# Patient Record
Sex: Male | Born: 1940 | Race: Black or African American | Hispanic: No | Marital: Married | Smoking: Never smoker
Health system: Southern US, Community
[De-identification: ages and names within clinical notes are randomized; demographics above are authoritative.]

## PROBLEM LIST (undated history)

## (undated) DIAGNOSIS — I2510 Atherosclerotic heart disease of native coronary artery without angina pectoris: Secondary | ICD-10-CM

## (undated) DIAGNOSIS — E119 Type 2 diabetes mellitus without complications: Secondary | ICD-10-CM

## (undated) DIAGNOSIS — N183 Chronic kidney disease, stage 3 unspecified: Secondary | ICD-10-CM

## (undated) DIAGNOSIS — J90 Pleural effusion, not elsewhere classified: Secondary | ICD-10-CM

## (undated) DIAGNOSIS — I5032 Chronic diastolic (congestive) heart failure: Secondary | ICD-10-CM

## (undated) DIAGNOSIS — I10 Essential (primary) hypertension: Secondary | ICD-10-CM

## (undated) DIAGNOSIS — I739 Peripheral vascular disease, unspecified: Secondary | ICD-10-CM

## (undated) DIAGNOSIS — E782 Mixed hyperlipidemia: Secondary | ICD-10-CM

## (undated) HISTORY — DX: Chronic kidney disease, stage 3 unspecified: N18.30

## (undated) HISTORY — DX: Peripheral vascular disease, unspecified: I73.9

## (undated) HISTORY — DX: Pleural effusion, not elsewhere classified: J90

## (undated) HISTORY — DX: Essential (primary) hypertension: I10

## (undated) HISTORY — DX: Atherosclerotic heart disease of native coronary artery without angina pectoris: I25.10

## (undated) HISTORY — DX: Chronic kidney disease, stage 3 (moderate): N18.3

## (undated) HISTORY — DX: Chronic diastolic (congestive) heart failure: I50.32

## (undated) HISTORY — DX: Mixed hyperlipidemia: E78.2

## (undated) HISTORY — DX: Type 2 diabetes mellitus without complications: E11.9

---

## 1979-09-29 HISTORY — PX: OTHER SURGICAL HISTORY: SHX169

## 1997-09-28 HISTORY — PX: OTHER SURGICAL HISTORY: SHX169

## 1998-01-17 ENCOUNTER — Inpatient Hospital Stay (HOSPITAL_COMMUNITY): Admission: RE | Admit: 1998-01-17 | Discharge: 1998-01-22 | Payer: Self-pay | Admitting: Specialist

## 1998-02-26 ENCOUNTER — Encounter
Admission: RE | Admit: 1998-02-26 | Discharge: 1998-05-27 | Payer: Self-pay | Admitting: Physical Medicine and Rehabilitation

## 2000-06-14 ENCOUNTER — Encounter: Payer: Self-pay | Admitting: Internal Medicine

## 2000-06-14 ENCOUNTER — Ambulatory Visit (HOSPITAL_COMMUNITY): Admission: RE | Admit: 2000-06-14 | Discharge: 2000-06-14 | Payer: Self-pay | Admitting: *Deleted

## 2002-07-17 ENCOUNTER — Ambulatory Visit (HOSPITAL_COMMUNITY): Admission: RE | Admit: 2002-07-17 | Discharge: 2002-07-17 | Payer: Self-pay | Admitting: Internal Medicine

## 2003-08-31 ENCOUNTER — Emergency Department (HOSPITAL_COMMUNITY): Admission: EM | Admit: 2003-08-31 | Discharge: 2003-08-31 | Payer: Self-pay | Admitting: Emergency Medicine

## 2003-09-04 ENCOUNTER — Emergency Department (HOSPITAL_COMMUNITY): Admission: EM | Admit: 2003-09-04 | Discharge: 2003-09-04 | Payer: Self-pay | Admitting: *Deleted

## 2003-09-29 HISTORY — PX: TRANSURETHRAL RESECTION OF PROSTATE: SHX73

## 2003-09-29 HISTORY — PX: CYSTOSCOPY WITH URETHRAL DILATATION: SHX5125

## 2003-10-03 ENCOUNTER — Emergency Department (HOSPITAL_COMMUNITY): Admission: EM | Admit: 2003-10-03 | Discharge: 2003-10-03 | Payer: Self-pay | Admitting: Emergency Medicine

## 2003-10-15 ENCOUNTER — Inpatient Hospital Stay (HOSPITAL_COMMUNITY): Admission: RE | Admit: 2003-10-15 | Discharge: 2003-10-17 | Payer: Self-pay | Admitting: Urology

## 2004-02-11 ENCOUNTER — Ambulatory Visit (HOSPITAL_COMMUNITY): Admission: RE | Admit: 2004-02-11 | Discharge: 2004-02-11 | Payer: Self-pay | Admitting: Urology

## 2005-11-14 ENCOUNTER — Emergency Department (HOSPITAL_COMMUNITY): Admission: EM | Admit: 2005-11-14 | Discharge: 2005-11-14 | Payer: Self-pay | Admitting: Emergency Medicine

## 2005-11-15 ENCOUNTER — Emergency Department (HOSPITAL_COMMUNITY): Admission: EM | Admit: 2005-11-15 | Discharge: 2005-11-15 | Payer: Self-pay | Admitting: Emergency Medicine

## 2005-11-16 ENCOUNTER — Emergency Department (HOSPITAL_COMMUNITY): Admission: EM | Admit: 2005-11-16 | Discharge: 2005-11-17 | Payer: Self-pay | Admitting: Emergency Medicine

## 2007-01-06 ENCOUNTER — Ambulatory Visit: Payer: Self-pay | Admitting: Cardiology

## 2007-01-25 ENCOUNTER — Ambulatory Visit: Payer: Self-pay | Admitting: Cardiology

## 2007-11-05 ENCOUNTER — Ambulatory Visit: Payer: Self-pay | Admitting: Cardiology

## 2008-05-02 ENCOUNTER — Ambulatory Visit: Payer: Self-pay | Admitting: Cardiology

## 2008-05-03 ENCOUNTER — Ambulatory Visit: Payer: Self-pay | Admitting: Cardiology

## 2008-05-03 ENCOUNTER — Ambulatory Visit: Payer: Self-pay | Admitting: Oncology

## 2008-05-03 ENCOUNTER — Ambulatory Visit: Payer: Self-pay | Admitting: Pulmonary Disease

## 2008-05-03 ENCOUNTER — Inpatient Hospital Stay (HOSPITAL_COMMUNITY): Admission: AD | Admit: 2008-05-03 | Discharge: 2008-05-15 | Payer: Self-pay | Admitting: Cardiology

## 2008-05-04 ENCOUNTER — Encounter: Payer: Self-pay | Admitting: Cardiology

## 2008-05-31 ENCOUNTER — Ambulatory Visit: Payer: Self-pay | Admitting: Cardiology

## 2008-06-01 ENCOUNTER — Ambulatory Visit: Payer: Self-pay

## 2008-06-19 ENCOUNTER — Encounter (HOSPITAL_COMMUNITY): Admission: RE | Admit: 2008-06-19 | Discharge: 2008-09-17 | Payer: Self-pay | Admitting: Nephrology

## 2008-07-10 ENCOUNTER — Ambulatory Visit: Payer: Self-pay | Admitting: Cardiology

## 2008-08-09 ENCOUNTER — Ambulatory Visit: Payer: Self-pay | Admitting: Cardiology

## 2008-08-29 ENCOUNTER — Ambulatory Visit: Payer: Self-pay | Admitting: Internal Medicine

## 2008-08-29 DIAGNOSIS — J90 Pleural effusion, not elsewhere classified: Secondary | ICD-10-CM | POA: Insufficient documentation

## 2008-08-29 DIAGNOSIS — N19 Unspecified kidney failure: Secondary | ICD-10-CM | POA: Insufficient documentation

## 2008-09-27 ENCOUNTER — Encounter (HOSPITAL_COMMUNITY): Admission: RE | Admit: 2008-09-27 | Discharge: 2008-09-27 | Payer: Self-pay | Admitting: Nephrology

## 2008-09-28 ENCOUNTER — Encounter (HOSPITAL_COMMUNITY): Admission: RE | Admit: 2008-09-28 | Discharge: 2008-12-27 | Payer: Self-pay | Admitting: Nephrology

## 2008-09-28 HISTORY — PX: OTHER SURGICAL HISTORY: SHX169

## 2008-11-13 ENCOUNTER — Ambulatory Visit: Payer: Self-pay | Admitting: Internal Medicine

## 2009-01-03 ENCOUNTER — Encounter (HOSPITAL_COMMUNITY): Admission: RE | Admit: 2009-01-03 | Discharge: 2009-04-03 | Payer: Self-pay | Admitting: Nephrology

## 2009-01-11 ENCOUNTER — Encounter: Payer: Self-pay | Admitting: Cardiology

## 2009-01-28 ENCOUNTER — Ambulatory Visit: Payer: Self-pay | Admitting: Cardiology

## 2009-04-02 ENCOUNTER — Encounter: Payer: Self-pay | Admitting: Internal Medicine

## 2009-04-02 ENCOUNTER — Encounter: Admission: RE | Admit: 2009-04-02 | Discharge: 2009-04-02 | Payer: Self-pay | Admitting: Nephrology

## 2009-04-04 ENCOUNTER — Ambulatory Visit: Payer: Self-pay | Admitting: Internal Medicine

## 2009-04-04 ENCOUNTER — Other Ambulatory Visit: Admission: RE | Admit: 2009-04-04 | Discharge: 2009-04-04 | Payer: Self-pay | Admitting: Internal Medicine

## 2009-04-04 ENCOUNTER — Encounter: Payer: Self-pay | Admitting: Internal Medicine

## 2009-04-08 LAB — CONVERTED CEMR LAB: LD, Fluid: 129 units/L — ABNORMAL HIGH (ref 3–23)

## 2009-04-11 ENCOUNTER — Encounter (HOSPITAL_COMMUNITY): Admission: RE | Admit: 2009-04-11 | Discharge: 2009-07-10 | Payer: Self-pay | Admitting: Nephrology

## 2009-05-16 ENCOUNTER — Ambulatory Visit: Payer: Self-pay | Admitting: Internal Medicine

## 2009-06-16 ENCOUNTER — Ambulatory Visit: Payer: Self-pay | Admitting: Cardiology

## 2009-07-03 ENCOUNTER — Encounter: Admission: RE | Admit: 2009-07-03 | Discharge: 2009-07-03 | Payer: Self-pay | Admitting: Thoracic Surgery

## 2009-07-03 ENCOUNTER — Ambulatory Visit: Payer: Self-pay | Admitting: Thoracic Surgery

## 2009-07-03 ENCOUNTER — Encounter: Payer: Self-pay | Admitting: Cardiology

## 2009-07-10 ENCOUNTER — Encounter: Payer: Self-pay | Admitting: Thoracic Surgery

## 2009-07-10 ENCOUNTER — Encounter (HOSPITAL_COMMUNITY): Admission: RE | Admit: 2009-07-10 | Discharge: 2009-10-08 | Payer: Self-pay | Admitting: Nephrology

## 2009-07-12 ENCOUNTER — Ambulatory Visit: Payer: Self-pay | Admitting: Internal Medicine

## 2009-07-16 ENCOUNTER — Ambulatory Visit: Payer: Self-pay | Admitting: Thoracic Surgery

## 2009-07-16 ENCOUNTER — Encounter: Payer: Self-pay | Admitting: Thoracic Surgery

## 2009-07-16 ENCOUNTER — Ambulatory Visit (HOSPITAL_COMMUNITY): Admission: RE | Admit: 2009-07-16 | Discharge: 2009-07-16 | Payer: Self-pay | Admitting: Thoracic Surgery

## 2009-07-16 ENCOUNTER — Encounter: Payer: Self-pay | Admitting: Cardiology

## 2009-07-23 ENCOUNTER — Ambulatory Visit: Payer: Self-pay | Admitting: Thoracic Surgery

## 2009-07-23 ENCOUNTER — Encounter: Payer: Self-pay | Admitting: Cardiology

## 2009-08-07 ENCOUNTER — Encounter: Payer: Self-pay | Admitting: Cardiology

## 2009-08-07 ENCOUNTER — Ambulatory Visit: Payer: Self-pay | Admitting: Thoracic Surgery

## 2009-08-07 ENCOUNTER — Encounter: Admission: RE | Admit: 2009-08-07 | Discharge: 2009-08-07 | Payer: Self-pay | Admitting: Thoracic Surgery

## 2009-08-12 ENCOUNTER — Ambulatory Visit (HOSPITAL_COMMUNITY): Admission: RE | Admit: 2009-08-12 | Discharge: 2009-08-12 | Payer: Self-pay | Admitting: Thoracic Surgery

## 2009-08-12 ENCOUNTER — Encounter: Payer: Self-pay | Admitting: Cardiology

## 2009-08-16 ENCOUNTER — Ambulatory Visit: Payer: Self-pay | Admitting: Cardiology

## 2009-08-16 DIAGNOSIS — I5032 Chronic diastolic (congestive) heart failure: Secondary | ICD-10-CM | POA: Insufficient documentation

## 2009-08-16 DIAGNOSIS — I2510 Atherosclerotic heart disease of native coronary artery without angina pectoris: Secondary | ICD-10-CM | POA: Insufficient documentation

## 2009-08-16 DIAGNOSIS — E782 Mixed hyperlipidemia: Secondary | ICD-10-CM

## 2009-08-20 ENCOUNTER — Encounter: Payer: Self-pay | Admitting: Cardiology

## 2009-08-20 ENCOUNTER — Ambulatory Visit: Payer: Self-pay | Admitting: Thoracic Surgery

## 2009-08-20 ENCOUNTER — Encounter: Admission: RE | Admit: 2009-08-20 | Discharge: 2009-08-20 | Payer: Self-pay | Admitting: Thoracic Surgery

## 2009-10-02 ENCOUNTER — Encounter: Payer: Self-pay | Admitting: Cardiology

## 2009-10-02 ENCOUNTER — Ambulatory Visit: Payer: Self-pay | Admitting: Thoracic Surgery

## 2009-10-02 ENCOUNTER — Encounter: Admission: RE | Admit: 2009-10-02 | Discharge: 2009-10-02 | Payer: Self-pay | Admitting: Thoracic Surgery

## 2009-10-14 IMAGING — CR DG CHEST 1V PORT
1 series · 1 of 1 positions shown · non-contrast
Comparison: None

CLINICAL DATA: Shortness of breath.  Ischemic heart disease.

PORTABLE CHEST - 1 VIEW

[AP]
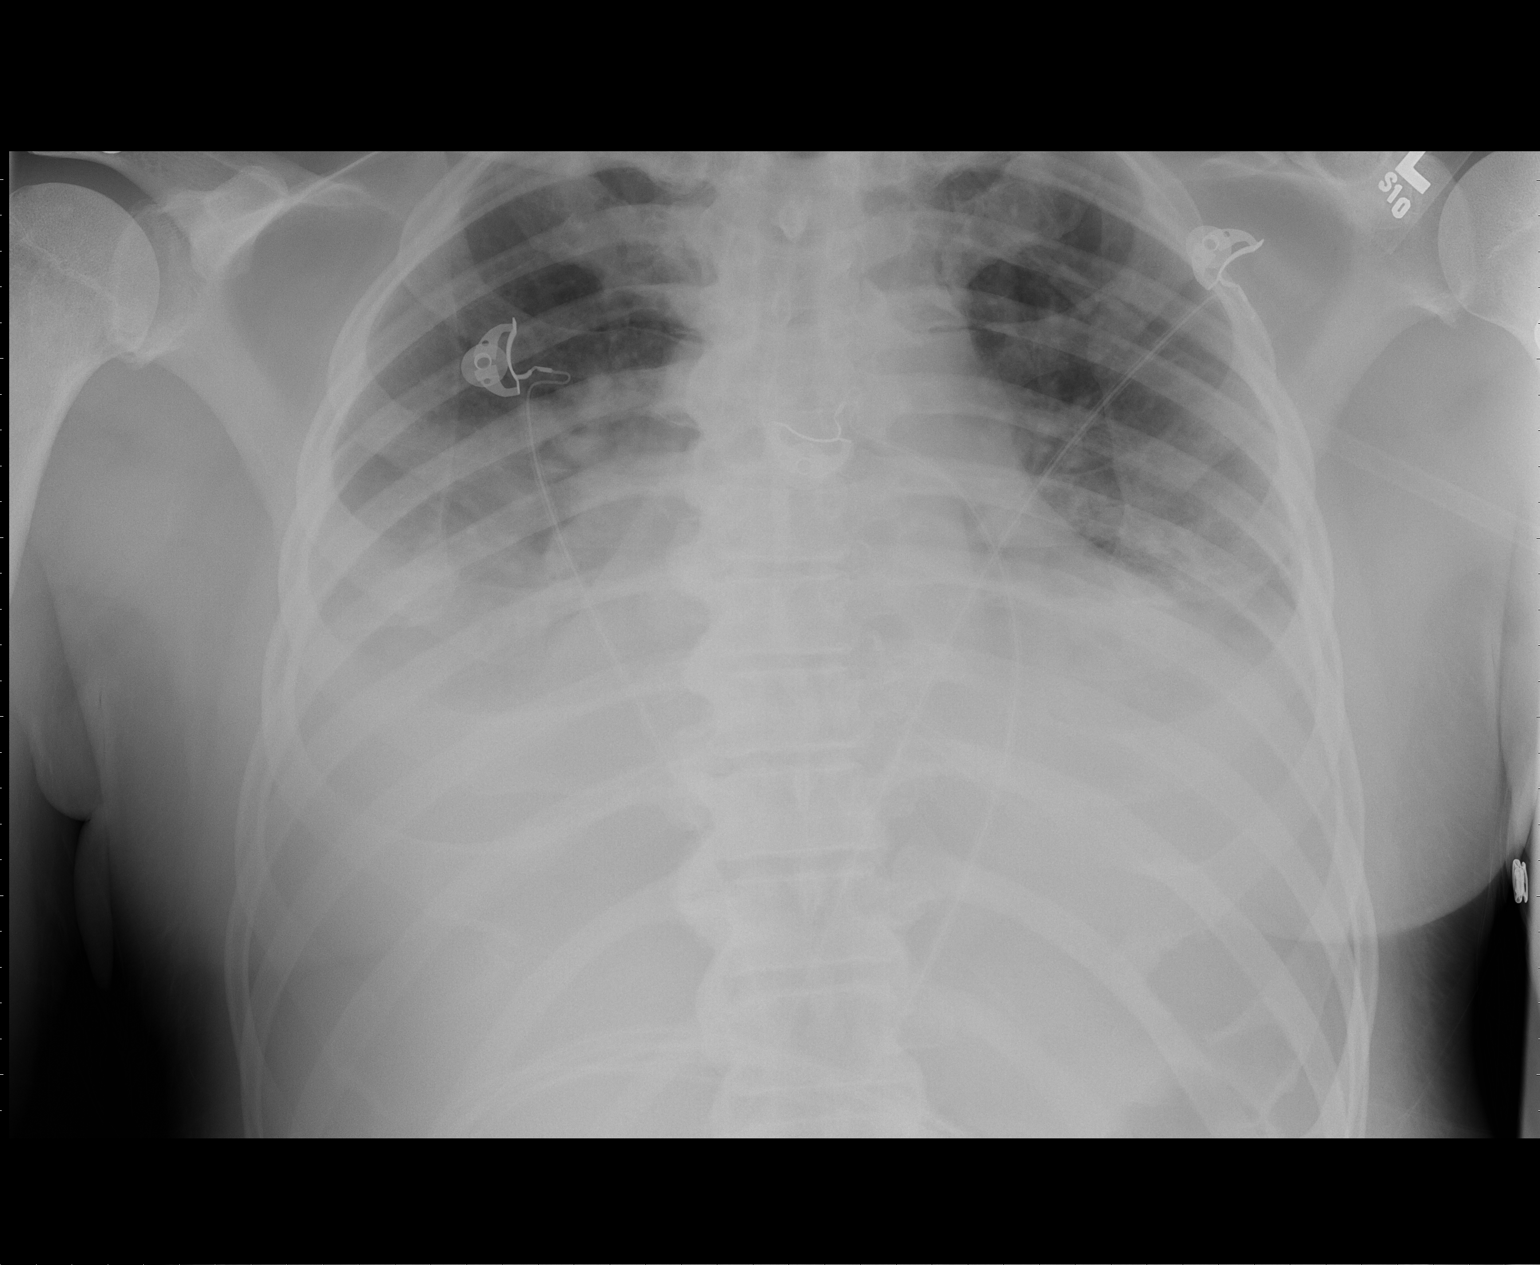

[1 of 1 positions shown; findings below may reference images not displayed]

FINDINGS: Moderate thoracic spondylosis. Midline trachea. Probable
mild cardiomegaly.  Mildly limited exam due to AP portable apical
lordotic technique.  Small bilateral pleural effusions. No
pneumothorax.  Low lung volumes.  Probable concurrent mild
interstitial edema.  Bibasilar airspace disease.
IMPRESSION: 1.  Mildly limited exam due to AP portable apical lordotic
technique.
2.  Low lung volumes with small bilateral pleural effusions and
probable congestive failure.
3.  Bibasilar airspace disease.  Either atelectasis or concurrent
infection.

## 2009-10-16 ENCOUNTER — Encounter (HOSPITAL_COMMUNITY): Admission: RE | Admit: 2009-10-16 | Discharge: 2010-01-14 | Payer: Self-pay | Admitting: Nephrology

## 2009-10-23 ENCOUNTER — Encounter: Payer: Self-pay | Admitting: Cardiology

## 2009-10-30 ENCOUNTER — Encounter: Payer: Self-pay | Admitting: Cardiology

## 2009-11-22 ENCOUNTER — Encounter: Payer: Self-pay | Admitting: Cardiology

## 2010-01-17 ENCOUNTER — Encounter: Payer: Self-pay | Admitting: Cardiology

## 2010-01-17 ENCOUNTER — Encounter (HOSPITAL_COMMUNITY): Admission: RE | Admit: 2010-01-17 | Discharge: 2010-04-17 | Payer: Self-pay | Admitting: Nephrology

## 2010-01-28 ENCOUNTER — Ambulatory Visit: Payer: Self-pay | Admitting: Cardiology

## 2010-01-28 DIAGNOSIS — I10 Essential (primary) hypertension: Secondary | ICD-10-CM | POA: Insufficient documentation

## 2010-02-05 ENCOUNTER — Encounter: Payer: Self-pay | Admitting: Cardiology

## 2010-02-11 ENCOUNTER — Encounter (INDEPENDENT_AMBULATORY_CARE_PROVIDER_SITE_OTHER): Payer: Self-pay | Admitting: *Deleted

## 2010-02-17 ENCOUNTER — Encounter: Payer: Self-pay | Admitting: Cardiology

## 2010-04-25 ENCOUNTER — Encounter (HOSPITAL_COMMUNITY): Admission: RE | Admit: 2010-04-25 | Discharge: 2010-07-24 | Payer: Self-pay | Admitting: Nephrology

## 2010-07-15 ENCOUNTER — Encounter: Payer: Self-pay | Admitting: Cardiology

## 2010-07-21 ENCOUNTER — Encounter: Payer: Self-pay | Admitting: Cardiology

## 2010-07-29 ENCOUNTER — Ambulatory Visit: Payer: Self-pay | Admitting: Cardiology

## 2010-08-01 ENCOUNTER — Encounter (HOSPITAL_COMMUNITY)
Admission: RE | Admit: 2010-08-01 | Discharge: 2010-10-28 | Payer: Self-pay | Source: Home / Self Care | Attending: Nephrology | Admitting: Nephrology

## 2010-08-13 ENCOUNTER — Encounter: Payer: Self-pay | Admitting: Cardiology

## 2010-09-14 IMAGING — CR DG CHEST 2V
2 series · 2 of 2 positions shown · non-contrast
Comparison: 04/02/2009

CLINICAL DATA: Pleural effusion.  Shortness of breath.  Status post
right thoracentesis.

CHEST - 2 VIEW

[view not recorded (1 of 2)]
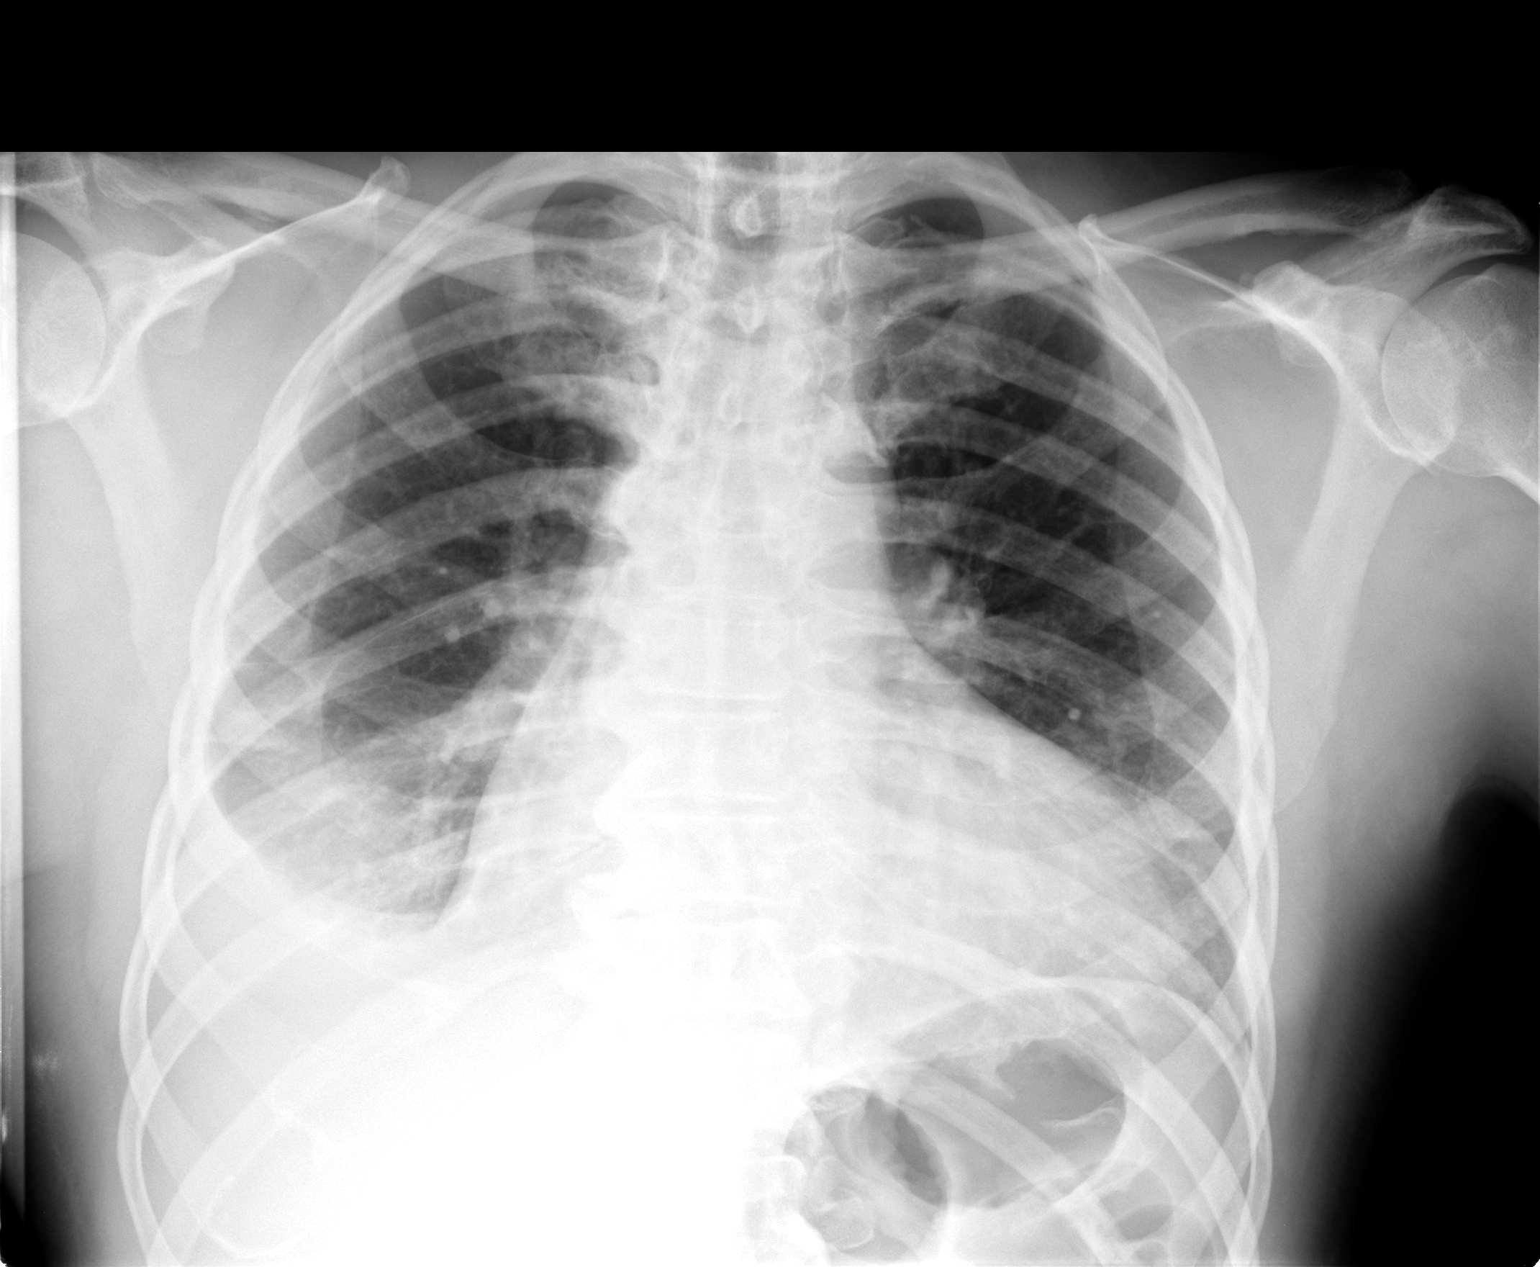

[view not recorded (2 of 2)]
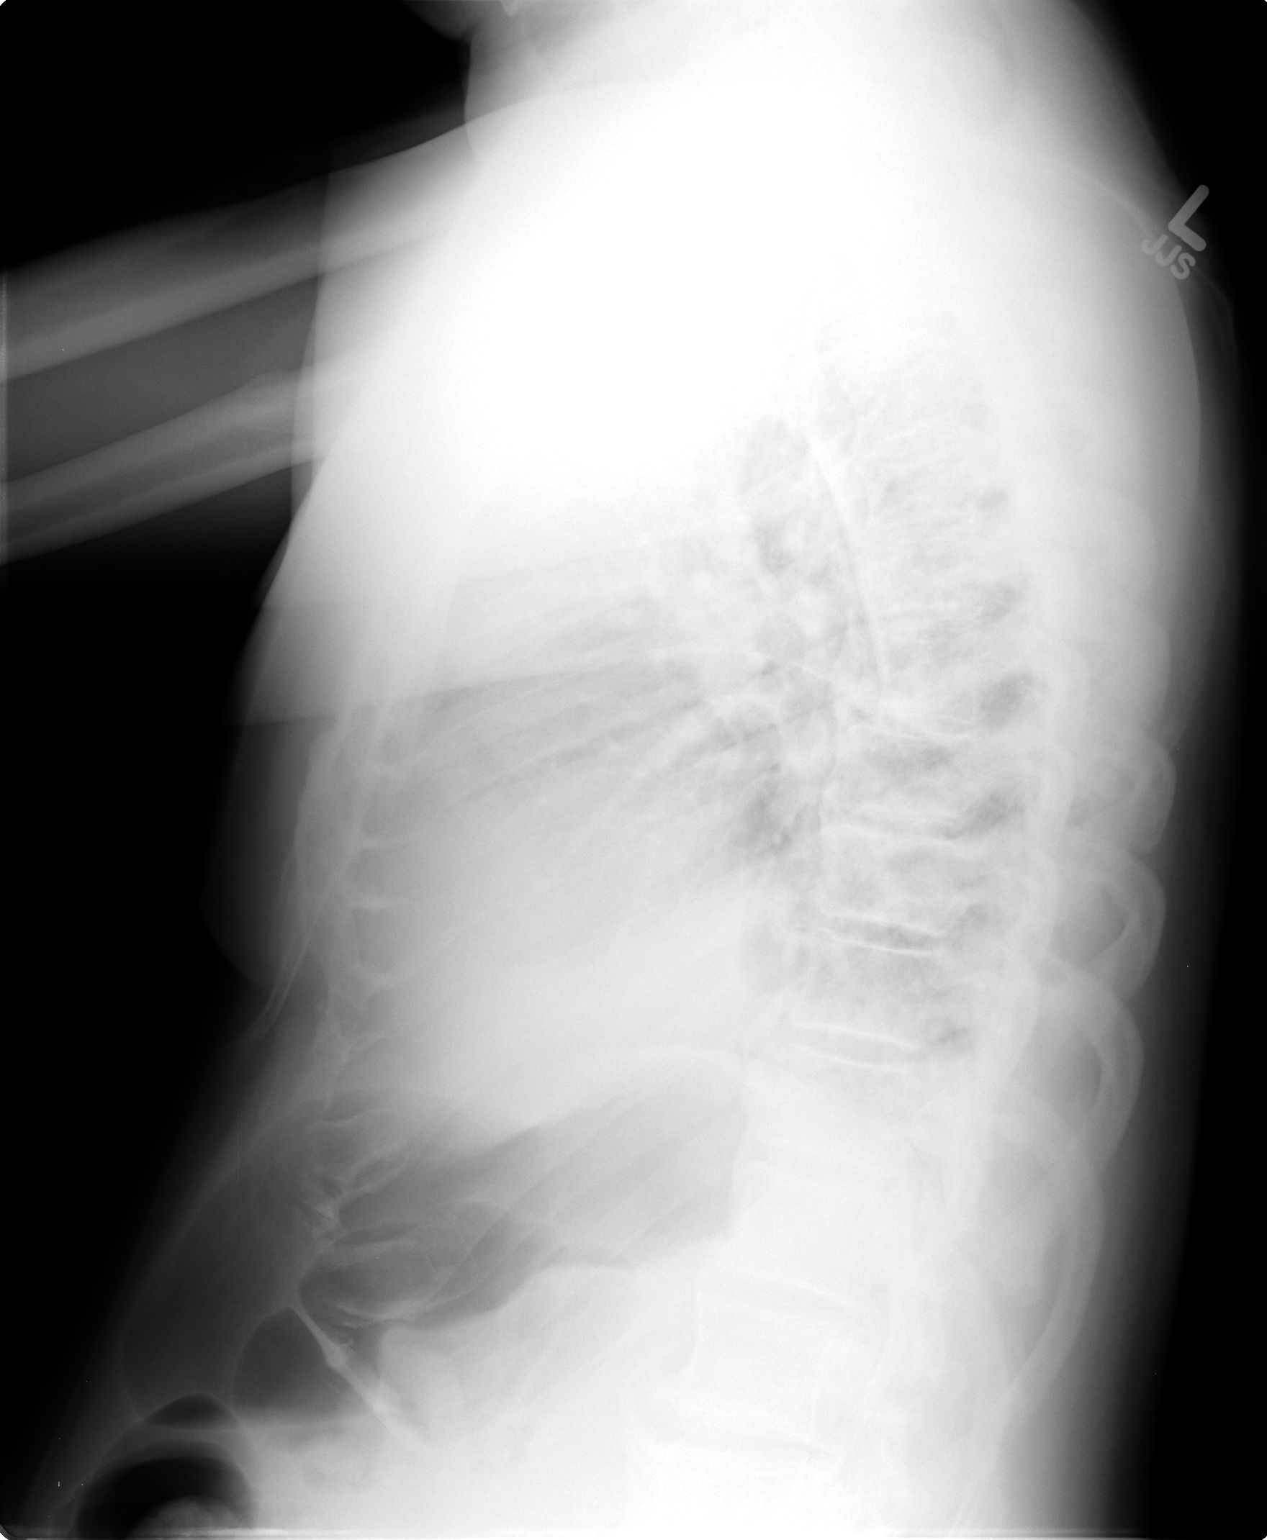

[2 of 2 positions shown; findings below may reference images not displayed]

FINDINGS: Right pleural effusion has decreased in size since
previous study.  There is no evidence of pneumothorax.

Decreased atelectasis or infiltrate is seen in the right lung base.
Left lung remains grossly clear.  Cardiomegaly remains stable.
IMPRESSION: Decreased right pleural effusion and right lower lung atelectasis
versus infiltrate.  No evidence of pneumothorax.

## 2010-10-03 LAB — RENAL FUNCTION PANEL
Albumin: 3.5 g/dL (ref 3.5–5.2)
BUN: 69 mg/dL — ABNORMAL HIGH (ref 6–23)
CO2: 30 mEq/L (ref 19–32)
Calcium: 9.4 mg/dL (ref 8.4–10.5)
Chloride: 97 mEq/L (ref 96–112)
Creatinine, Ser: 3.74 mg/dL — ABNORMAL HIGH (ref 0.4–1.5)
GFR calc Af Amer: 20 mL/min — ABNORMAL LOW (ref >60)
GFR calc non Af Amer: 16 mL/min — ABNORMAL LOW (ref >60)
Glucose, Bld: 127 mg/dL — ABNORMAL HIGH (ref 70–99)
Phosphorus: 4.4 mg/dL (ref 2.3–4.6)
Potassium: 4.2 mEq/L (ref 3.5–5.1)
Sodium: 139 mEq/L (ref 135–145)

## 2010-10-03 LAB — POCT HEMOGLOBIN-HEMACUE: Hemoglobin: 12.5 g/dL — ABNORMAL LOW (ref 13.0–17.0)

## 2010-10-13 LAB — IRON AND TIBC
Iron: 106 ug/dL (ref 42–135)
Saturation Ratios: 46 % (ref 20–55)
TIBC: 230 ug/dL (ref 215–435)
UIBC: 124 ug/dL

## 2010-10-13 LAB — PTH, INTACT AND CALCIUM
Calcium, Total (PTH): 8.6 mg/dL (ref 8.4–10.5)
PTH: 85.1 pg/mL — ABNORMAL HIGH (ref 14.0–72.0)

## 2010-10-13 LAB — FERRITIN: Ferritin: 1075 ng/mL — ABNORMAL HIGH (ref 22–322)

## 2010-10-22 LAB — POCT HEMOGLOBIN-HEMACUE: Hemoglobin: 11.8 g/dL — ABNORMAL LOW (ref 13.0–17.0)

## 2010-10-28 NOTE — Assessment & Plan Note (Signed)
Summary: 98monthfollowup/rcm   Visit Type:  Follow-up Primary Provider:  Dr. Doreen Beam   History of Present Illness: 70 year old male presents for a followup visit. Cardiac history includes 2 vessel coronary artery disease including total occlusion of the right coronary artery was severely diseased AV groove circumflex, LVEF 60% with diastolic dysfunction and mild mitral regurgitation.   He has chronic renal insufficiency followed by Dr. Hyman Hopes. He has also had recurrent right pleural effusions ultimately requiring a right-sided VATS procedure with Pleurx catheter. This was followed by Dr. Edwyna Shell.  Recent labs from 22 April revealed hemoglobin 11.7, potassium 4.2, BUN 49, creatinine 2.8.  He indicates improved dyspnea on exertion since the procedures outlined above. He states he has followup visits with both Dr. Hyman Hopes and Dr. Sherril Croon over the next month. He has not had a recent lipid profile or liver function tests. Blood pressure is significantly elevated today, and he states this is not entirely typical. I reviewed his medications, and he states that he has been compliant.\par  He denies any anginal chest pain or nitroglycerin use.  Current Medications (verified): 1)  Klor-Con M20 20 Meq Cr-Tabs (Potassium Chloride Crys Cr) .Marland Kitchen.. 1 Once Daily 2)  Hydralazine Hcl 50 Mg Tabs (Hydralazine Hcl) .... 2 Tabs Every 6 Hours 3)  Labetalol Hcl 300 Mg Tabs (Labetalol Hcl) .Marland Kitchen.. 1 Three Times A Day 4)  Isosorbide Mononitrate Cr 60 Mg Xr24h-Tab (Isosorbide Mononitrate) .Marland Kitchen.. 1 Two Times A Day 5)  Aspirin Adult Low Strength 81 Mg Tbec (Aspirin) .Marland Kitchen.. 1 Once Daily 6)  Torsemide 20 Mg Tabs (Torsemide) .... 3 Tabs Two Times A Day 7)  Lipitor 20 Mg Tabs (Atorvastatin Calcium) .Marland Kitchen.. 1 Once Daily 8)  Clonidine Hcl 0.3 Mg Tabs (Clonidine Hcl) .Marland Kitchen.. 1 Three Times A Day 9)  Docusoft S 100 Mg Caps (Docusate Sodium) .... As Needed 10)  Humulin 70/30 70-30 % Susp (Insulin Isophane & Regular) .Marland Kitchen.. 13 Am and 13units  Pm  Allergies (verified): No Known Drug Allergies  Past History:  Past Medical History: Last updated: 08/16/2009  RENAL FAILURE (ICD-586)..........................................................Marland KitchenWebb     - baseline 2.8 2008 HYPERTENSION (ICD-401.9) Diabetes Diastolic CHF/ IHD, LVEF 60%    - LHC 05/07/08 ([facility])   2 vessel dz, not bypassable      Wedge 22 , PA mean 27, co 5.4 Bilateral pleural effusions..............................................................Marland KitchenWert    - Right  thoracentesis 05/10/08 transudative    - Right thoracentesis April 04, 2009 x 800cc yellow serous> ex with predom lymphocytes/ cyt c/w chronic      inflammation with abundant small lymphocytes   Social History: Last updated: 08/29/2008 Never smoker Quit ETOH in 1988 Married with 3 children Disabled  Review of Systems  The patient denies anorexia, fever, weight gain, chest pain, syncope, prolonged cough, hemoptysis, melena, and hematochezia.         Otherwise reviewed and negative.  Vital Signs:  Patient profile:   70 year old male Weight:      167 pounds BMI:     23.38 Pulse rate:   65 / minute BP sitting:   218 / 85  (right arm)  Vitals Entered By: Dreama Saa, CNA (Jan 28, 2010 3:29 PM)  Physical Exam  Additional Exam:  Overweight male in no acute distress. HEENT: conjunctiva normal, oropharynx with poor dentition. Neck: Supple, no elevated jugular venous pressure or thyromegaly. Lungs: Crackles at the bases, right more prominent than left, air movement otherwise normal without wheezing. Nonlabored. Cardiac: Regular rate and rhythm with  soft apical systolic murmur, no pericardial rub. Abdomen: Soft, nontender, bowel sounds present. Thorax: Small keyhole incision on the right posteriorly with stitches in place. 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   EKG  Procedure date:  01/28/2010  Findings:      Sinus rhythm with prolonged PR interval  of 214 ms, left atrial enlargement, poor R-wave progression. Repolarization abnormalities.  Impression & Recommendations:  Problem # 1:  CORONARY ATHEROSCLEROSIS NATIVE CORONARY ARTERY (ICD-414.01)  Symptomatically stable without active angina. Being medically managed for 2 vessel obstructive disease without clear revascularization options. Followup planned for 6 months, sooner if needed.  The following medications were removed from the medication list:    Diltiazem Hcl Er Beads 360 Mg Xr24h-cap (Diltiazem hcl er beads) .Marland Kitchen... 1 once daily His updated medication list for this problem includes:    Labetalol Hcl 300 Mg Tabs (Labetalol hcl) .Marland Kitchen... 1 three times a day    Isosorbide Mononitrate Cr 60 Mg Xr24h-tab (Isosorbide mononitrate) .Marland Kitchen... 1 two times a day    Aspirin Adult Low Strength 81 Mg Tbec (Aspirin) .Marland Kitchen... 1 once daily  Problem # 2:  MIXED HYPERLIPIDEMIA (ICD-272.2)  Due for followup fasting lipid profile and liver function tests. He reports tolerating Lipitor.  His updated medication list for this problem includes:    Lipitor 20 Mg Tabs (Atorvastatin calcium) .Marland Kitchen... 1 once daily  Orders: T-Lipid Profile [phone]) T-Hepatic Function [phone])  Problem # 3:  ESSENTIAL HYPERTENSION, BENIGN (ICD-401.1)  Blood pressure poorly controlled today. Patient states it is typically not in this range. He reports compliance with medications. Present medications are at fairly good doses, and his heart rate probably limits further up titration of labetalol. It may be that Norvasc would be the next consideration. He has follow up with Dr. Hyman Hopes this month.  The following medications were removed from the medication list:    Diltiazem Hcl Er Beads 360 Mg Xr24h-cap (Diltiazem hcl er beads) .Marland Kitchen... 1 once daily His updated medication list for this problem includes:    Hydralazine Hcl 50 Mg Tabs (Hydralazine hcl) .Marland Kitchen... 2 tabs every 6 hours    Labetalol Hcl 300 Mg Tabs (Labetalol hcl) .Marland Kitchen... 1  three times a day    Aspirin Adult Low Strength 81 Mg Tbec (Aspirin) .Marland Kitchen... 1 once daily    Torsemide 20 Mg Tabs (Torsemide) .Marland KitchenMarland KitchenMarland KitchenMarland Kitchen 3 tabs two times a day    Clonidine Hcl 0.3 Mg Tabs (Clonidine hcl) .Marland Kitchen... 1 three times a day  Patient Instructions: 1)  Your physician wants you to follow-up in: 6 months. You will receive a reminder letter in the mail one-two months in advance. If you don't receive a letter, please call our office to schedule the follow-up appointment. 2)  Your physician recommends that you go to the [facility] for a FASTING lipid profile and liver function labs. Do not eat or drink after midnight. 3)  Your physician recommends that you continue on your current medications as directed. Please refer to the Current Medication list given to you today.

## 2010-10-28 NOTE — Letter (Signed)
Summary: Engineer, materials at [facility]  [address]   [place], [place] [address]   Phone: [phone]  Fax: [phone]        Feb 11, 2010 MRN: [id number]    [name] [address] [place], [place]  [address]    Dear [name],  Your test ordered by Selena Batten has been reviewed by your physician (or physician assistant) and was found to be normal or stable. Your physician (or physician assistant) felt no changes were needed at this time.  ____ Echocardiogram  ____ Cardiac Stress Test  __X__ Lab Work-Bad cholesterol (LDL) and good cholesterol (HDL) both look good. Liver function also okay. Continue same medications.  ____ Peripheral vascular study of arms, legs or neck  ____ CT scan or X-ray  ____ Lung or Breathing test  ____ Other:   Thank you.   Cyril Loosen, RN, BSN    Duane Boston, M.D., F.A.C.C. Thressa Sheller, M.D., F.A.C.C. Oneal Grout, M.D., F.A.C.C. Cheree Ditto, M.D., F.A.C.C. Daiva Nakayama, M.D., F.A.C.C. Kenney Houseman, M.D., F.A.C.C. Jeanne Ivan, PA-C

## 2010-10-28 NOTE — Assessment & Plan Note (Signed)
Summary: 6 mo fu per nov reminder   Visit Type:  Follow-up Primary Provider:  Dr. Doreen Beam   History of Present Illness: 70 year old male presents for follow-up. He was seen back in May. No further medications changes made at follow-up with Dr. Hyman Hopes in May. At that time BUN 47, creatinine 3.0.  Labs from 11 May showed cholesterol 160, HDL 72, LDL 80, triglycerides 42, AST 34, ALT 41.  He is here with his wife for followup. He denies any problems with angina and reports NYHA class 2-3 dyspnea on exertion. No palpitations or syncope. Blood pressure is significantly elevated again today. He states that a recent blood pressure check at home was "170/60." I reviewed his medications.   Preventive Screening-Counseling & Management  Alcohol-Tobacco     Smoking Status: never  Current Medications (verified): 1)  Klor-Con M20 20 Meq Cr-Tabs (Potassium Chloride Crys Cr) .Marland Kitchen.. 1 Once Daily 2)  Hydralazine Hcl 50 Mg Tabs (Hydralazine Hcl) .... 2 Tabs Every 6 Hours 3)  Labetalol Hcl 300 Mg Tabs (Labetalol Hcl) .Marland Kitchen.. 1 Three Times A Day 4)  Isosorbide Mononitrate Cr 60 Mg Xr24h-Tab (Isosorbide Mononitrate) .Marland Kitchen.. 1 Two Times A Day 5)  Aspirin Adult Low Strength 81 Mg Tbec (Aspirin) .Marland Kitchen.. 1 Once Daily 6)  Torsemide 20 Mg Tabs (Torsemide) .... 3 Tabs Two Times A Day 7)  Lipitor 20 Mg Tabs (Atorvastatin Calcium) .Marland Kitchen.. 1 Once Daily 8)  Clonidine Hcl 0.3 Mg Tabs (Clonidine Hcl) .Marland Kitchen.. 1 Three Times A Day 9)  Docusoft S 100 Mg Caps (Docusate Sodium) .... As Needed 10)  Humulin 70/30 70-30 % Susp (Insulin Isophane & Regular) .Marland Kitchen.. 13 Am and 13units Pm 11)  Zemplar 1 Mcg Caps (Paricalcitol) .... Take 1 Tablet By Mouth Once A Day 12)  Taztia Xt 360 Mg Xr24h-Cap (Diltiazem Hcl Er Beads) .... Take 1 Tablet By Mouth Once A Day 13)  Hydralazine Hcl 50 Mg Tabs (Hydralazine Hcl) .... Take 2 By Mouth Every Six Hours  Allergies (verified): No Known Drug Allergies  Comments:  Nurse/Medical Assistant: The patient's  medication bottles and allergies were reviewed with the patient and were updated in the Medication and Allergy Lists.  Past History:  Past Surgical History: Last updated: 08/29/2008 Bilateral leg amputation below the knee 1981 and 1995  Social History: Last updated: 08/29/2008 Never smoker Quit ETOH in 1988 Married with 3 children Disabled  Past Medical History:  RENAL FAILURE (ICD-586)..........................................................Marland KitchenWebb     - baseline 2.8 2008 HYPERTENSION (ICD-401.9) Diabetes Diastolic CHF/ IHD, LVEF 60%    - LHC 05/07/08 ([facility])   2 vessel dz, not bypassable      Wedge 22 , PA mean 27, co 5.4 Bilateral pleural effusions..............................................................Marland KitchenWert    - Right  thoracentesis 05/10/08 transudative    - Right thoracentesis April 04, 2009 x 800cc yellow serous> ex with predom lymphocytes/ cyt c/w chronic      inflammation with abundant small lymphocytes   Review of Systems       The patient complains of dyspnea on exertion.  The patient denies anorexia, fever, weight loss, chest pain, syncope, headaches, hemoptysis, melena, and hematochezia.         Otherwise reviewed and negative except as outlined.  Vital Signs:  Patient profile:   70 year old male Height:      71 inches Weight:      166 pounds Pulse rate:   69 / minute BP sitting:   200 / 78  (left arm) Cuff  size:   regular  Vitals Entered By: Carlye Grippe (July 29, 2010 1:25 PM)  Physical Exam  Additional Exam:  Overweight male in no acute distress. HEENT: conjunctiva normal, oropharynx with poor dentition. Neck: Supple, no elevated jugular venous pressure or thyromegaly. Lungs: Diminished at the bases, no wheezing. Nonlabored. Cardiac: Regular rate and rhythm with soft apical systolic murmur, no pericardial rub. Abdomen: Soft, nontender, bowel sounds present. Extremities: Status post below the knee amputations with prostheses in  place. Skin: Warm and dry. Musculoskeletal: No kyphosis. Neuropsychiatric: Alert and oriented x3, affect appropriate.   Cardiac Cath  Procedure date:  05/07/2008  Findings:       ANGIOGRAPHIC DATA:   1. As previously noted, no ventriculography was performed, also no       aortic root aortography was performed.   2. There was moderately heavy calcification involved in coronary       arteries, which were visible on plain fluoroscopy.   3. The right coronary artery was severely diseased with small caliber       vessel, diabetic in appearance, totally occluded, and with bridging       collaterals to the distal vessel.  There were left-to-right       collaterals, but the terminal vessels appeared to be severely small       and not suitable for bypass.   4. The left main is without critical disease.   5. The LAD is fairly heavily calcified.  It has a diabetic appearance,       and appears to be somewhat diffusely diseased.  However, there is       not specific high-grade focal narrowing.  Fortunately, this vessel       courses to the apex.  One would estimate that the vessel would be       somewhat larger in caliber in normal state, but again no isolated       focal stenosis is noted.   6. The ramus intermedius is a calcified vessel, particularly at the       ostium.  It is hypodense and probably represents a 60% ostial       narrowing, although could only be determined by intravascular       ultrasound.  There is probably 60-70% mid-narrowing.  The AV       circumflex is a very small-caliber vessel, being pencil like and       typical of diabetes.  There is 80% ostial narrowing and the vessel       appears to be severely and diffusely diseased.  Impression & Recommendations:  Problem # 1:  ESSENTIAL HYPERTENSION, BENIGN (ICD-401.1)  Blood pressure not well controlled. I reviewed his medication list. Plan to transition him from Taztia XT 360 mg daily to Norvasc 10 mg daily.  labetalol could be further advanced from there. Otherwise continue regular followup with Dr. Hyman Hopes.  The following medications were removed from the medication list:    Taztia Xt 360 Mg Xr24h-cap (Diltiazem hcl er beads) .Marland Kitchen... Take 1 tablet by mouth once a day His updated medication list for this problem includes:    Hydralazine Hcl 50 Mg Tabs (Hydralazine hcl) .Marland Kitchen... 2 tabs every 6 hours    Labetalol Hcl 300 Mg Tabs (Labetalol hcl) .Marland Kitchen... 1 three times a day    Aspirin Adult Low Strength 81 Mg Tbec (Aspirin) .Marland Kitchen... 1 once daily    Torsemide 20 Mg Tabs (Torsemide) .Marland KitchenMarland KitchenMarland KitchenMarland Kitchen 3 tabs two times a day  Clonidine Hcl 0.3 Mg Tabs (Clonidine hcl) .Marland Kitchen... 1 three times a day    Hydralazine Hcl 50 Mg Tabs (Hydralazine hcl) .Marland Kitchen... Take 2 by mouth every six hours    Amlodipine Besylate 10 Mg Tabs (Amlodipine besylate) .Marland Kitchen... Take one tablet by mouth daily (finish current supply of taztia first)  Problem # 2:  CORONARY ATHEROSCLEROSIS NATIVE CORONARY ARTERY (ICD-414.01)  Symptomatically stable without active angina. Continue medical therapy. Followup in 6 months.  The following medications were removed from the medication list:    Taztia Xt 360 Mg Xr24h-cap (Diltiazem hcl er beads) .Marland Kitchen... Take 1 tablet by mouth once a day His updated medication list for this problem includes:    Labetalol Hcl 300 Mg Tabs (Labetalol hcl) .Marland Kitchen... 1 three times a day    Isosorbide Mononitrate Cr 60 Mg Xr24h-tab (Isosorbide mononitrate) .Marland Kitchen... 1 two times a day    Aspirin Adult Low Strength 81 Mg Tbec (Aspirin) .Marland Kitchen... 1 once daily    Amlodipine Besylate 10 Mg Tabs (Amlodipine besylate) .Marland Kitchen... Take one tablet by mouth daily (finish current supply of taztia first)  Problem # 3:  MIXED HYPERLIPIDEMIA (ICD-272.2)  LDL has been at goal with good HDL as well. No medication changes made.  His updated medication list for this problem includes:    Lipitor 20 Mg Tabs (Atorvastatin calcium) .Marland Kitchen... 1 once daily  Patient Instructions: 1)  Finish  current supply of Taztia, then stop.  2)  Start Norvasc (amlodipine) 10mg  by mouth once daily. DO NOT START THIS MEDICATION UNTIL YOU FINISH TAZTIA.  3)  Your physician wants you to follow-up in: 6 months. You will receive a reminder letter in the mail one-two months in advance. If you don't receive a letter, please call our office to schedule the follow-up appointment. Prescriptions: AMLODIPINE BESYLATE 10 MG TABS (AMLODIPINE BESYLATE) Take one tablet by mouth daily (Finish current supply of [place] first)  #30 x 6   Entered by:   Cyril Loosen, RN, BSN   Authorized by:   Loreli Slot, MD, [facility]   Signed by:   Cyril Loosen, RN, BSN on 07/29/2010   Method used:   Electronically to        [facility]* (retail)       [address]       [place], [place]  [address]       Ph: [phone]       Fax: [phone]   RxID:   [id number]   Handout requested.

## 2010-10-28 NOTE — Letter (Signed)
Summary: External Correspondence/ OFFICE VISIT [facility]  External Correspondence/ OFFICE VISIT [facility]   Imported By: Dorise Hiss 02/27/2010 11:25:16  _____________________________________________________________________  External Attachment:    Type:   Image     Comment:   External Document

## 2010-10-28 NOTE — Progress Notes (Signed)
Summary: Office Visit/ TCT LETTER  Office Visit/ TCT LETTER   Imported By: Dorise Hiss 01/28/2010 09:56:42  _____________________________________________________________________  External Attachment:    Type:   Image     Comment:   External Document

## 2010-10-28 NOTE — Letter (Signed)
Summary: External Correspondence/ OFFICE VISIT [facility]  External Correspondence/ OFFICE VISIT [facility]   Imported By: Dorise Hiss 08/27/2010 15:08:01  _____________________________________________________________________  External Attachment:    Type:   Image     Comment:   External Document

## 2010-10-28 NOTE — Progress Notes (Signed)
Summary: Office Visit/ TCT LETTER  Office Visit/ TCT LETTER   Imported By: Dorise Hiss 01/28/2010 09:55:34  _____________________________________________________________________  External Attachment:    Type:   Image     Comment:   External Document

## 2010-10-31 ENCOUNTER — Other Ambulatory Visit: Payer: Self-pay

## 2010-10-31 ENCOUNTER — Encounter (HOSPITAL_COMMUNITY): Payer: Medicare Other | Attending: Nephrology

## 2010-10-31 DIAGNOSIS — N2581 Secondary hyperparathyroidism of renal origin: Secondary | ICD-10-CM | POA: Insufficient documentation

## 2010-10-31 DIAGNOSIS — N183 Chronic kidney disease, stage 3 unspecified: Secondary | ICD-10-CM | POA: Insufficient documentation

## 2010-10-31 DIAGNOSIS — D638 Anemia in other chronic diseases classified elsewhere: Secondary | ICD-10-CM | POA: Insufficient documentation

## 2010-10-31 LAB — IRON AND TIBC
Saturation Ratios: 31 % (ref 20–55)
UIBC: 159 ug/dL

## 2010-10-31 LAB — RENAL FUNCTION PANEL
Albumin: 3.3 g/dL — ABNORMAL LOW (ref 3.5–5.2)
BUN: 76 mg/dL — ABNORMAL HIGH (ref 6–23)
Calcium: 9 mg/dL (ref 8.4–10.5)
Chloride: 97 mEq/L (ref 96–112)
Creatinine, Ser: 4.03 mg/dL — ABNORMAL HIGH (ref 0.4–1.5)

## 2010-11-03 LAB — PTH, INTACT AND CALCIUM
Calcium, Total (PTH): 8.8 mg/dL (ref 8.4–10.5)
PTH: 126.3 pg/mL — ABNORMAL HIGH (ref 14.0–72.0)

## 2010-11-14 ENCOUNTER — Other Ambulatory Visit: Payer: Self-pay

## 2010-11-14 ENCOUNTER — Encounter (HOSPITAL_COMMUNITY): Payer: Medicare Other

## 2010-11-28 ENCOUNTER — Other Ambulatory Visit: Payer: Self-pay | Admitting: Nephrology

## 2010-11-28 ENCOUNTER — Encounter (HOSPITAL_COMMUNITY): Payer: Medicare Other | Attending: Nephrology

## 2010-11-28 DIAGNOSIS — N183 Chronic kidney disease, stage 3 unspecified: Secondary | ICD-10-CM | POA: Insufficient documentation

## 2010-11-28 DIAGNOSIS — D638 Anemia in other chronic diseases classified elsewhere: Secondary | ICD-10-CM | POA: Insufficient documentation

## 2010-11-28 LAB — RENAL FUNCTION PANEL
Albumin: 3.5 g/dL (ref 3.5–5.2)
BUN: 76 mg/dL — ABNORMAL HIGH (ref 6–23)
CO2: 31 mEq/L (ref 19–32)
GFR calc Af Amer: 20 mL/min — ABNORMAL LOW (ref >60)
GFR calc non Af Amer: 16 mL/min — ABNORMAL LOW (ref >60)
Glucose, Bld: 313 mg/dL — ABNORMAL HIGH (ref 70–99)
Phosphorus: 3.6 mg/dL (ref 2.3–4.6)

## 2010-11-28 LAB — IRON AND TIBC
Iron: 95 ug/dL (ref 42–135)
TIBC: 235 ug/dL (ref 215–435)

## 2010-11-28 LAB — FERRITIN: Ferritin: 863 ng/mL — ABNORMAL HIGH (ref 22–322)

## 2010-12-01 LAB — PTH, INTACT AND CALCIUM: PTH: 74.4 pg/mL — ABNORMAL HIGH (ref 14.0–72.0)

## 2010-12-09 LAB — RENAL FUNCTION PANEL
BUN: 66 mg/dL — ABNORMAL HIGH (ref 6–23)
CO2: 31 mEq/L (ref 19–32)
Calcium: 8.7 mg/dL (ref 8.4–10.5)
Calcium: 8.7 mg/dL (ref 8.4–10.5)
Creatinine, Ser: 3.67 mg/dL — ABNORMAL HIGH (ref 0.4–1.5)
Creatinine, Ser: 3.9 mg/dL — ABNORMAL HIGH (ref 0.4–1.5)
GFR calc Af Amer: 19 mL/min — ABNORMAL LOW (ref >60)
GFR calc non Af Amer: 15 mL/min — ABNORMAL LOW (ref >60)
Glucose, Bld: 200 mg/dL — ABNORMAL HIGH (ref 70–99)
Phosphorus: 3.4 mg/dL (ref 2.3–4.6)
Phosphorus: 3.6 mg/dL (ref 2.3–4.6)
Sodium: 135 mEq/L (ref 135–145)
Sodium: 136 mEq/L (ref 135–145)

## 2010-12-09 LAB — IRON AND TIBC
Iron: 90 ug/dL (ref 42–135)
Saturation Ratios: 19 % — ABNORMAL LOW (ref 20–55)
Saturation Ratios: 42 % (ref 20–55)
TIBC: 214 ug/dL — ABNORMAL LOW (ref 215–435)
TIBC: 232 ug/dL (ref 215–435)
UIBC: 124 ug/dL
UIBC: 188 ug/dL

## 2010-12-09 LAB — POCT HEMOGLOBIN-HEMACUE
Hemoglobin: 11.2 g/dL — ABNORMAL LOW (ref 13.0–17.0)
Hemoglobin: 12.4 g/dL — ABNORMAL LOW (ref 13.0–17.0)

## 2010-12-09 LAB — FERRITIN: Ferritin: 1603 ng/mL — ABNORMAL HIGH (ref 22–322)

## 2010-12-09 LAB — PTH, INTACT AND CALCIUM
Calcium, Total (PTH): 8.8 mg/dL (ref 8.4–10.5)
PTH: 88.2 pg/mL — ABNORMAL HIGH (ref 14.0–72.0)
PTH: 94.8 pg/mL — ABNORMAL HIGH (ref 14.0–72.0)

## 2010-12-11 LAB — POCT HEMOGLOBIN-HEMACUE
Hemoglobin: 12 g/dL — ABNORMAL LOW (ref 13.0–17.0)
Hemoglobin: 11.5 g/dL — ABNORMAL LOW (ref 13.0–17.0)

## 2010-12-11 LAB — RENAL FUNCTION PANEL
Albumin: 2.9 g/dL — ABNORMAL LOW (ref 3.5–5.2)
CO2: 30 mEq/L (ref 19–32)
Calcium: 8.6 mg/dL (ref 8.4–10.5)
Calcium: 8.6 mg/dL (ref 8.4–10.5)
Creatinine, Ser: 3.43 mg/dL — ABNORMAL HIGH (ref 0.4–1.5)
GFR calc Af Amer: 22 mL/min — ABNORMAL LOW (ref >60)
GFR calc Af Amer: 22 mL/min — ABNORMAL LOW (ref >60)
GFR calc non Af Amer: 18 mL/min — ABNORMAL LOW (ref >60)
Glucose, Bld: 209 mg/dL — ABNORMAL HIGH (ref 70–99)
Glucose, Bld: 311 mg/dL — ABNORMAL HIGH (ref 70–99)
Phosphorus: 3.5 mg/dL (ref 2.3–4.6)
Phosphorus: 4 mg/dL (ref 2.3–4.6)
Potassium: 4.3 mEq/L (ref 3.5–5.1)
Sodium: 134 mEq/L — ABNORMAL LOW (ref 135–145)
Sodium: 137 mEq/L (ref 135–145)

## 2010-12-11 LAB — IRON AND TIBC
Saturation Ratios: 23 % (ref 20–55)
UIBC: 186 ug/dL
UIBC: 194 ug/dL

## 2010-12-11 LAB — PTH, INTACT AND CALCIUM: PTH: 127.6 pg/mL — ABNORMAL HIGH (ref 14.0–72.0)

## 2010-12-11 LAB — FERRITIN: Ferritin: 290 ng/mL (ref 22–322)

## 2010-12-12 ENCOUNTER — Encounter (HOSPITAL_COMMUNITY): Payer: Medicare Other

## 2010-12-12 ENCOUNTER — Other Ambulatory Visit: Payer: Self-pay

## 2010-12-12 LAB — PTH, INTACT AND CALCIUM
Calcium, Total (PTH): 8.5 mg/dL (ref 8.4–10.5)
PTH: 125.1 pg/mL — ABNORMAL HIGH (ref 14.0–72.0)

## 2010-12-12 LAB — RENAL FUNCTION PANEL
Albumin: 2.8 g/dL — ABNORMAL LOW (ref 3.5–5.2)
BUN: 60 mg/dL — ABNORMAL HIGH (ref 6–23)
Chloride: 98 mEq/L (ref 96–112)
GFR calc Af Amer: 20 mL/min — ABNORMAL LOW (ref >60)
GFR calc non Af Amer: 17 mL/min — ABNORMAL LOW (ref >60)
Phosphorus: 3.2 mg/dL (ref 2.3–4.6)
Potassium: 4.6 mEq/L (ref 3.5–5.1)
Sodium: 138 mEq/L (ref 135–145)

## 2010-12-12 LAB — POCT HEMOGLOBIN-HEMACUE
Hemoglobin: 10.6 g/dL — ABNORMAL LOW (ref 13.0–17.0)
Hemoglobin: 12.3 g/dL — ABNORMAL LOW (ref 13.0–17.0)

## 2010-12-13 LAB — POCT HEMOGLOBIN-HEMACUE
Hemoglobin: 11.4 g/dL — ABNORMAL LOW (ref 13.0–17.0)
Hemoglobin: 12 g/dL — ABNORMAL LOW (ref 13.0–17.0)

## 2010-12-14 LAB — POCT HEMOGLOBIN-HEMACUE
Hemoglobin: 9.5 g/dL — ABNORMAL LOW (ref 13.0–17.0)
Hemoglobin: 9.7 g/dL — ABNORMAL LOW (ref 13.0–17.0)
Hemoglobin: 11.5 g/dL — ABNORMAL LOW (ref 13.0–17.0)

## 2010-12-14 LAB — RENAL FUNCTION PANEL
Albumin: 2.3 g/dL — ABNORMAL LOW (ref 3.5–5.2)
BUN: 42 mg/dL — ABNORMAL HIGH (ref 6–23)
Chloride: 98 mEq/L (ref 96–112)
Glucose, Bld: 214 mg/dL — ABNORMAL HIGH (ref 70–99)
Potassium: 3.9 mEq/L (ref 3.5–5.1)
Sodium: 134 mEq/L — ABNORMAL LOW (ref 135–145)

## 2010-12-14 LAB — IRON AND TIBC
Iron: 52 ug/dL (ref 42–135)
TIBC: 213 ug/dL — ABNORMAL LOW (ref 215–435)
UIBC: 160 ug/dL

## 2010-12-14 LAB — FERRITIN: Ferritin: 314 ng/mL (ref 22–322)

## 2010-12-14 LAB — PTH, INTACT AND CALCIUM: Calcium, Total (PTH): 7.6 mg/dL — ABNORMAL LOW (ref 8.4–10.5)

## 2010-12-15 LAB — IRON AND TIBC
Iron: 69 ug/dL (ref 42–135)
TIBC: 215 ug/dL (ref 215–435)

## 2010-12-15 LAB — POCT HEMOGLOBIN-HEMACUE
Hemoglobin: 11.8 g/dL — ABNORMAL LOW (ref 13.0–17.0)
Hemoglobin: 11.5 g/dL — ABNORMAL LOW (ref 13.0–17.0)

## 2010-12-15 LAB — RENAL FUNCTION PANEL
CO2: 30 mEq/L (ref 19–32)
Chloride: 103 mEq/L (ref 96–112)
GFR calc non Af Amer: 23 mL/min — ABNORMAL LOW (ref >60)
Glucose, Bld: 252 mg/dL — ABNORMAL HIGH (ref 70–99)
Potassium: 4.3 mEq/L (ref 3.5–5.1)
Sodium: 139 mEq/L (ref 135–145)

## 2010-12-15 LAB — FERRITIN: Ferritin: 501 ng/mL — ABNORMAL HIGH (ref 22–322)

## 2010-12-16 LAB — RENAL FUNCTION PANEL
CO2: 32 mEq/L (ref 19–32)
GFR calc Af Amer: 27 mL/min — ABNORMAL LOW (ref >60)
GFR calc non Af Amer: 22 mL/min — ABNORMAL LOW (ref >60)
Glucose, Bld: 290 mg/dL — ABNORMAL HIGH (ref 70–99)
Potassium: 4.2 mEq/L (ref 3.5–5.1)
Sodium: 136 mEq/L (ref 135–145)

## 2010-12-16 LAB — IRON AND TIBC
Iron: 51 ug/dL (ref 42–135)
TIBC: 199 ug/dL — ABNORMAL LOW (ref 215–435)

## 2010-12-16 LAB — POCT HEMOGLOBIN-HEMACUE
Hemoglobin: 12.1 g/dL — ABNORMAL LOW (ref 13.0–17.0)
Hemoglobin: 11.7 g/dL — ABNORMAL LOW (ref 13.0–17.0)

## 2010-12-17 LAB — CBC
HCT: 34.9 % — ABNORMAL LOW (ref 39.0–52.0)
Hemoglobin: 11.6 g/dL — ABNORMAL LOW (ref 13.0–17.0)
Hemoglobin: 10.3 g/dL — ABNORMAL LOW (ref 13.0–17.0)
MCHC: 33.2 g/dL (ref 30.0–36.0)
MCV: 97.1 fL (ref 78.0–100.0)
Platelets: 197 10*3/uL (ref 150–400)
RBC: 3.18 MIL/uL — ABNORMAL LOW (ref 4.22–5.81)
RDW: 17.2 % — ABNORMAL HIGH (ref 11.5–15.5)
WBC: 4.9 10*3/uL (ref 4.0–10.5)
WBC: 7.7 10*3/uL (ref 4.0–10.5)

## 2010-12-17 LAB — RENAL FUNCTION PANEL
BUN: 50 mg/dL — ABNORMAL HIGH (ref 6–23)
CO2: 30 mEq/L (ref 19–32)
Glucose, Bld: 270 mg/dL — ABNORMAL HIGH (ref 70–99)
Phosphorus: 2.9 mg/dL (ref 2.3–4.6)
Potassium: 4.4 mEq/L (ref 3.5–5.1)

## 2010-12-17 LAB — PTH, INTACT AND CALCIUM: PTH: 150.4 pg/mL — ABNORMAL HIGH (ref 14.0–72.0)

## 2010-12-17 LAB — FERRITIN: Ferritin: 952 ng/mL — ABNORMAL HIGH (ref 22–322)

## 2010-12-17 LAB — IRON AND TIBC
Iron: 51 ug/dL (ref 42–135)
Saturation Ratios: 25 % (ref 20–55)
TIBC: 202 ug/dL — ABNORMAL LOW (ref 215–435)

## 2010-12-17 LAB — POCT HEMOGLOBIN-HEMACUE
Hemoglobin: 11.5 g/dL — ABNORMAL LOW (ref 13.0–17.0)
Hemoglobin: 10.8 g/dL — ABNORMAL LOW (ref 13.0–17.0)

## 2010-12-21 LAB — RENAL FUNCTION PANEL
BUN: 48 mg/dL — ABNORMAL HIGH (ref 6–23)
CO2: 29 mEq/L (ref 19–32)
Calcium: 8.2 mg/dL — ABNORMAL LOW (ref 8.4–10.5)
Creatinine, Ser: 2.91 mg/dL — ABNORMAL HIGH (ref 0.4–1.5)
Glucose, Bld: 177 mg/dL — ABNORMAL HIGH (ref 70–99)
Phosphorus: 3.3 mg/dL (ref 2.3–4.6)
Sodium: 137 mEq/L (ref 135–145)

## 2010-12-21 LAB — IRON AND TIBC
TIBC: 194 ug/dL — ABNORMAL LOW (ref 215–435)
UIBC: 147 ug/dL

## 2010-12-21 LAB — FERRITIN: Ferritin: 737 ng/mL — ABNORMAL HIGH (ref 22–322)

## 2010-12-21 LAB — PTH, INTACT AND CALCIUM: PTH: 193.1 pg/mL — ABNORMAL HIGH (ref 14.0–72.0)

## 2010-12-26 ENCOUNTER — Other Ambulatory Visit: Payer: Self-pay | Admitting: Nephrology

## 2010-12-26 ENCOUNTER — Encounter (HOSPITAL_COMMUNITY): Payer: Medicare Other

## 2010-12-29 ENCOUNTER — Other Ambulatory Visit: Payer: Self-pay | Admitting: *Deleted

## 2010-12-29 LAB — POCT HEMOGLOBIN-HEMACUE
Hemoglobin: 11.8 g/dL — ABNORMAL LOW (ref 13.0–17.0)
Hemoglobin: 9.6 g/dL — ABNORMAL LOW (ref 13.0–17.0)

## 2010-12-29 MED ORDER — TORSEMIDE 20 MG PO TABS: 60.0000 mg | ORAL_TABLET | Freq: Two times a day (BID) | ORAL | Status: AC

## 2010-12-30 LAB — IRON AND TIBC
Iron: 29 ug/dL — ABNORMAL LOW (ref 42–135)
Saturation Ratios: 13 % — ABNORMAL LOW (ref 20–55)
TIBC: 231 ug/dL (ref 215–435)
UIBC: 202 ug/dL

## 2010-12-30 LAB — RENAL FUNCTION PANEL
Albumin: 2.3 g/dL — ABNORMAL LOW (ref 3.5–5.2)
BUN: 48 mg/dL — ABNORMAL HIGH (ref 6–23)
CO2: 30 mEq/L (ref 19–32)
Chloride: 98 mEq/L (ref 96–112)
Creatinine, Ser: 2.91 mg/dL — ABNORMAL HIGH (ref 0.4–1.5)
Potassium: 4.3 mEq/L (ref 3.5–5.1)

## 2010-12-31 LAB — RENAL FUNCTION PANEL
CO2: 25 mEq/L (ref 19–32)
Calcium: 8.2 mg/dL — ABNORMAL LOW (ref 8.4–10.5)
Chloride: 101 mEq/L (ref 96–112)
Creatinine, Ser: 2.96 mg/dL — ABNORMAL HIGH (ref 0.4–1.5)
GFR calc Af Amer: 26 mL/min — ABNORMAL LOW (ref >60)
GFR calc non Af Amer: 21 mL/min — ABNORMAL LOW (ref >60)
Glucose, Bld: 170 mg/dL — ABNORMAL HIGH (ref 70–99)
Sodium: 138 mEq/L (ref 135–145)

## 2010-12-31 LAB — IRON AND TIBC
Iron: 40 ug/dL — ABNORMAL LOW (ref 42–135)
TIBC: 243 ug/dL (ref 215–435)
UIBC: 203 ug/dL

## 2010-12-31 LAB — POCT HEMOGLOBIN-HEMACUE
Hemoglobin: 9.1 g/dL — ABNORMAL LOW (ref 13.0–17.0)
Hemoglobin: 10.6 g/dL — ABNORMAL LOW (ref 13.0–17.0)

## 2010-12-31 LAB — FERRITIN: Ferritin: 339 ng/mL — ABNORMAL HIGH (ref 22–322)

## 2011-01-01 LAB — COMPREHENSIVE METABOLIC PANEL
ALT: 27 U/L (ref 0–53)
AST: 22 U/L (ref 0–37)
Alkaline Phosphatase: 89 U/L (ref 39–117)
CO2: 29 mEq/L (ref 19–32)
Calcium: 8.5 mg/dL (ref 8.4–10.5)
Chloride: 104 mEq/L (ref 96–112)
GFR calc Af Amer: 26 mL/min — ABNORMAL LOW (ref >60)
GFR calc non Af Amer: 21 mL/min — ABNORMAL LOW (ref >60)
Glucose, Bld: 179 mg/dL — ABNORMAL HIGH (ref 70–99)
Potassium: 4.2 mEq/L (ref 3.5–5.1)
Sodium: 141 mEq/L (ref 135–145)

## 2011-01-01 LAB — AFB CULTURE WITH SMEAR (NOT AT ARMC)

## 2011-01-01 LAB — RENAL FUNCTION PANEL
Albumin: 2.5 g/dL — ABNORMAL LOW (ref 3.5–5.2)
BUN: 44 mg/dL — ABNORMAL HIGH (ref 6–23)
GFR calc Af Amer: 26 mL/min — ABNORMAL LOW (ref >60)

## 2011-01-01 LAB — BODY FLUID CULTURE

## 2011-01-01 LAB — CBC
Hemoglobin: 10.9 g/dL — ABNORMAL LOW (ref 13.0–17.0)
RBC: 3.37 MIL/uL — ABNORMAL LOW (ref 4.22–5.81)
WBC: 6 10*3/uL (ref 4.0–10.5)

## 2011-01-01 LAB — POCT HEMOGLOBIN-HEMACUE
Hemoglobin: 10.5 g/dL — ABNORMAL LOW (ref 13.0–17.0)
Hemoglobin: 10.7 g/dL — ABNORMAL LOW (ref 13.0–17.0)
Hemoglobin: 10.7 g/dL — ABNORMAL LOW (ref 13.0–17.0)

## 2011-01-01 LAB — FERRITIN: Ferritin: 233 ng/mL (ref 22–322)

## 2011-01-01 LAB — GLUCOSE, CAPILLARY

## 2011-01-01 LAB — IRON AND TIBC
Iron: 29 ug/dL — ABNORMAL LOW (ref 42–135)
UIBC: 218 ug/dL

## 2011-01-01 LAB — FUNGUS CULTURE W SMEAR

## 2011-01-01 LAB — PTH, INTACT AND CALCIUM: PTH: 175.4 pg/mL — ABNORMAL HIGH (ref 14.0–72.0)

## 2011-01-02 LAB — PTH, INTACT AND CALCIUM: PTH: 173.5 pg/mL — ABNORMAL HIGH (ref 14.0–72.0)

## 2011-01-02 LAB — POCT HEMOGLOBIN-HEMACUE: Hemoglobin: 12.3 g/dL — ABNORMAL LOW (ref 13.0–17.0)

## 2011-01-02 LAB — RENAL FUNCTION PANEL
BUN: 41 mg/dL — ABNORMAL HIGH (ref 6–23)
CO2: 29 mEq/L (ref 19–32)
Chloride: 99 mEq/L (ref 96–112)
Glucose, Bld: 247 mg/dL — ABNORMAL HIGH (ref 70–99)
Phosphorus: 3.9 mg/dL (ref 2.3–4.6)
Potassium: 3.6 mEq/L (ref 3.5–5.1)
Sodium: 138 mEq/L (ref 135–145)

## 2011-01-02 LAB — IRON AND TIBC
Iron: 56 ug/dL (ref 42–135)
Saturation Ratios: 19 % — ABNORMAL LOW (ref 20–55)
TIBC: 289 ug/dL (ref 215–435)

## 2011-01-03 LAB — IRON AND TIBC
Iron: 25 ug/dL — ABNORMAL LOW (ref 42–135)
Saturation Ratios: 9 % — ABNORMAL LOW (ref 20–55)
TIBC: 269 ug/dL (ref 215–435)

## 2011-01-03 LAB — POCT HEMOGLOBIN-HEMACUE
Hemoglobin: 11.5 g/dL — ABNORMAL LOW (ref 13.0–17.0)
Hemoglobin: 11.6 g/dL — ABNORMAL LOW (ref 13.0–17.0)

## 2011-01-03 LAB — RENAL FUNCTION PANEL
BUN: 47 mg/dL — ABNORMAL HIGH (ref 6–23)
CO2: 29 mEq/L (ref 19–32)
Chloride: 102 mEq/L (ref 96–112)
Glucose, Bld: 224 mg/dL — ABNORMAL HIGH (ref 70–99)
Phosphorus: 3.7 mg/dL (ref 2.3–4.6)
Potassium: 3.6 mEq/L (ref 3.5–5.1)
Sodium: 140 mEq/L (ref 135–145)

## 2011-01-04 LAB — IRON AND TIBC
Iron: 42 ug/dL (ref 42–135)
Saturation Ratios: 15 % — ABNORMAL LOW (ref 20–55)
UIBC: 230 ug/dL

## 2011-01-04 LAB — RENAL FUNCTION PANEL
BUN: 54 mg/dL — ABNORMAL HIGH (ref 6–23)
CO2: 31 mEq/L (ref 19–32)
Calcium: 9 mg/dL (ref 8.4–10.5)
Chloride: 99 mEq/L (ref 96–112)
Creatinine, Ser: 3.29 mg/dL — ABNORMAL HIGH (ref 0.4–1.5)
Glucose, Bld: 231 mg/dL — ABNORMAL HIGH (ref 70–99)

## 2011-01-04 LAB — FERRITIN: Ferritin: 137 ng/mL (ref 22–322)

## 2011-01-04 LAB — POCT HEMOGLOBIN-HEMACUE: Hemoglobin: 11.6 g/dL — ABNORMAL LOW (ref 13.0–17.0)

## 2011-01-05 LAB — IRON AND TIBC
Iron: 25 ug/dL — ABNORMAL LOW (ref 42–135)
Saturation Ratios: 9 % — ABNORMAL LOW (ref 20–55)
TIBC: 289 ug/dL (ref 215–435)
UIBC: 264 ug/dL

## 2011-01-05 LAB — RENAL FUNCTION PANEL
BUN: 51 mg/dL — ABNORMAL HIGH (ref 6–23)
CO2: 27 mEq/L (ref 19–32)
Calcium: 8.4 mg/dL (ref 8.4–10.5)
Creatinine, Ser: 3.1 mg/dL — ABNORMAL HIGH (ref 0.4–1.5)
Glucose, Bld: 197 mg/dL — ABNORMAL HIGH (ref 70–99)
Sodium: 138 mEq/L (ref 135–145)

## 2011-01-05 LAB — FERRITIN: Ferritin: 130 ng/mL (ref 22–322)

## 2011-01-05 LAB — PTH, INTACT AND CALCIUM: PTH: 145.2 pg/mL — ABNORMAL HIGH (ref 14.0–72.0)

## 2011-01-05 LAB — POCT HEMOGLOBIN-HEMACUE: Hemoglobin: 11.4 g/dL — ABNORMAL LOW (ref 13.0–17.0)

## 2011-01-06 LAB — RENAL FUNCTION PANEL
BUN: 48 mg/dL — ABNORMAL HIGH (ref 6–23)
CO2: 31 mEq/L (ref 19–32)
Calcium: 8.3 mg/dL — ABNORMAL LOW (ref 8.4–10.5)
Glucose, Bld: 277 mg/dL — ABNORMAL HIGH (ref 70–99)
Potassium: 3.8 mEq/L (ref 3.5–5.1)
Sodium: 139 mEq/L (ref 135–145)

## 2011-01-06 LAB — IRON AND TIBC
Iron: 20 ug/dL — ABNORMAL LOW (ref 42–135)
Saturation Ratios: 11 % — ABNORMAL LOW (ref 20–55)
TIBC: 175 ug/dL — ABNORMAL LOW (ref 215–435)

## 2011-01-06 LAB — FERRITIN: Ferritin: 155 ng/mL (ref 22–322)

## 2011-01-07 LAB — RENAL FUNCTION PANEL
Albumin: 3.2 g/dL — ABNORMAL LOW (ref 3.5–5.2)
BUN: 57 mg/dL — ABNORMAL HIGH (ref 6–23)
Calcium: 9 mg/dL (ref 8.4–10.5)
Creatinine, Ser: 3.28 mg/dL — ABNORMAL HIGH (ref 0.4–1.5)
Phosphorus: 4.7 mg/dL — ABNORMAL HIGH (ref 2.3–4.6)

## 2011-01-07 LAB — CBC
HCT: 36.5 % — ABNORMAL LOW (ref 39.0–52.0)
Hemoglobin: 12.1 g/dL — ABNORMAL LOW (ref 13.0–17.0)
WBC: 5 10*3/uL (ref 4.0–10.5)

## 2011-01-07 LAB — POCT HEMOGLOBIN-HEMACUE: Hemoglobin: 11.6 g/dL — ABNORMAL LOW (ref 13.0–17.0)

## 2011-01-07 LAB — IRON AND TIBC
Saturation Ratios: 21 % (ref 20–55)
UIBC: 250 ug/dL

## 2011-01-07 LAB — PTH, INTACT AND CALCIUM
Calcium, Total (PTH): 8.9 mg/dL (ref 8.4–10.5)
PTH: 118.1 pg/mL — ABNORMAL HIGH (ref 14.0–72.0)

## 2011-01-08 LAB — RENAL FUNCTION PANEL
CO2: 32 mEq/L (ref 19–32)
Calcium: 8.8 mg/dL (ref 8.4–10.5)
Glucose, Bld: 223 mg/dL — ABNORMAL HIGH (ref 70–99)
Sodium: 141 mEq/L (ref 135–145)

## 2011-01-08 LAB — POCT HEMOGLOBIN-HEMACUE: Hemoglobin: 11.6 g/dL — ABNORMAL LOW (ref 13.0–17.0)

## 2011-01-08 LAB — FERRITIN: Ferritin: 152 ng/mL (ref 22–322)

## 2011-01-08 LAB — IRON AND TIBC
Iron: 73 ug/dL (ref 42–135)
TIBC: 286 ug/dL (ref 215–435)

## 2011-01-09 ENCOUNTER — Other Ambulatory Visit: Payer: Self-pay | Admitting: Nephrology

## 2011-01-09 ENCOUNTER — Encounter (HOSPITAL_COMMUNITY): Payer: Medicare Other | Attending: Nephrology

## 2011-01-09 DIAGNOSIS — D638 Anemia in other chronic diseases classified elsewhere: Secondary | ICD-10-CM | POA: Insufficient documentation

## 2011-01-09 DIAGNOSIS — N183 Chronic kidney disease, stage 3 unspecified: Secondary | ICD-10-CM | POA: Insufficient documentation

## 2011-01-12 LAB — RENAL FUNCTION PANEL
Albumin: 3.3 g/dL — ABNORMAL LOW (ref 3.5–5.2)
BUN: 48 mg/dL — ABNORMAL HIGH (ref 6–23)
BUN: 87 mg/dL — ABNORMAL HIGH (ref 6–23)
CO2: 30 mEq/L (ref 19–32)
Chloride: 100 mEq/L (ref 96–112)
Creatinine, Ser: 3.06 mg/dL — ABNORMAL HIGH (ref 0.4–1.5)
Creatinine, Ser: 4.6 mg/dL — ABNORMAL HIGH (ref 0.4–1.5)
Glucose, Bld: 183 mg/dL — ABNORMAL HIGH (ref 70–99)
Glucose, Bld: 209 mg/dL — ABNORMAL HIGH (ref 70–99)
Phosphorus: 4.7 mg/dL — ABNORMAL HIGH (ref 2.3–4.6)

## 2011-01-12 LAB — IRON AND TIBC
Iron: 62 ug/dL (ref 42–135)
Iron: 73 ug/dL (ref 42–135)
Saturation Ratios: 25 % (ref 20–55)
Saturation Ratios: 31 % (ref 20–55)
TIBC: 250 ug/dL (ref 215–435)
TIBC: 239 ug/dL (ref 215–435)
UIBC: 188 ug/dL
UIBC: 166 ug/dL

## 2011-01-12 LAB — PTH, INTACT AND CALCIUM: Calcium, Total (PTH): 8.6 mg/dL (ref 8.4–10.5)

## 2011-01-12 LAB — FERRITIN: Ferritin: 948 ng/mL — ABNORMAL HIGH (ref 22–322)

## 2011-01-13 LAB — POCT HEMOGLOBIN-HEMACUE: Hemoglobin: 11.2 g/dL — ABNORMAL LOW (ref 13.0–17.0)

## 2011-01-13 LAB — IRON AND TIBC
Iron: 77 ug/dL (ref 42–135)
Saturation Ratios: 28 % (ref 20–55)
TIBC: 279 ug/dL (ref 215–435)

## 2011-01-13 LAB — PTH, INTACT AND CALCIUM: PTH: 132.5 pg/mL — ABNORMAL HIGH (ref 14.0–72.0)

## 2011-01-13 LAB — RENAL FUNCTION PANEL
BUN: 54 mg/dL — ABNORMAL HIGH (ref 6–23)
CO2: 26 mEq/L (ref 19–32)
Glucose, Bld: 205 mg/dL — ABNORMAL HIGH (ref 70–99)
Phosphorus: 3.8 mg/dL (ref 2.3–4.6)
Potassium: 3.9 mEq/L (ref 3.5–5.1)

## 2011-01-13 LAB — FERRITIN: Ferritin: 163 ng/mL (ref 22–322)

## 2011-01-23 ENCOUNTER — Encounter (HOSPITAL_COMMUNITY): Payer: Medicare Other

## 2011-01-23 ENCOUNTER — Other Ambulatory Visit: Payer: Self-pay | Admitting: Nephrology

## 2011-01-26 LAB — POCT HEMOGLOBIN-HEMACUE: Hemoglobin: 12.2 g/dL — ABNORMAL LOW (ref 13.0–17.0)

## 2011-02-06 ENCOUNTER — Other Ambulatory Visit: Payer: Self-pay | Admitting: Nephrology

## 2011-02-06 ENCOUNTER — Encounter (HOSPITAL_COMMUNITY): Payer: Medicare Other | Attending: Nephrology

## 2011-02-06 DIAGNOSIS — N183 Chronic kidney disease, stage 3 unspecified: Secondary | ICD-10-CM | POA: Insufficient documentation

## 2011-02-06 DIAGNOSIS — D638 Anemia in other chronic diseases classified elsewhere: Secondary | ICD-10-CM | POA: Insufficient documentation

## 2011-02-06 LAB — RENAL FUNCTION PANEL
Albumin: 3.2 g/dL — ABNORMAL LOW (ref 3.5–5.2)
BUN: 72 mg/dL — ABNORMAL HIGH (ref 6–23)
Chloride: 98 mEq/L (ref 96–112)
GFR calc non Af Amer: 17 mL/min — ABNORMAL LOW (ref >60)
Phosphorus: 4.6 mg/dL (ref 2.3–4.6)
Potassium: 3.7 mEq/L (ref 3.5–5.1)
Sodium: 139 mEq/L (ref 135–145)

## 2011-02-06 LAB — IRON AND TIBC
Iron: 99 ug/dL (ref 42–135)
Saturation Ratios: 42 % (ref 20–55)
TIBC: 235 ug/dL (ref 215–435)

## 2011-02-09 LAB — PTH, INTACT AND CALCIUM
Calcium, Total (PTH): 8.9 mg/dL (ref 8.4–10.5)
PTH: 61.3 pg/mL (ref 14.0–72.0)

## 2011-02-10 LAB — POCT HEMOGLOBIN-HEMACUE: Hemoglobin: 11.3 g/dL — ABNORMAL LOW (ref 13.0–17.0)

## 2011-02-10 NOTE — Consult Note (Signed)
NAME:  [name], [name] NO.:  [id number]   MEDICAL RECORD NO.:  [id number]          PATIENT TYPE:  INP   LOCATION:  2007                         FACILITY:  [facility]   PHYSICIAN:  Felipa Evener, MD  DATE OF BIRTH:  Feb 27, 1941   DATE OF CONSULTATION:  05/03/2008  DATE OF DISCHARGE:                                 CONSULTATION   DATE OF CONSULTATION:  May 09, 2008.   REASON FOR CONSULTATION:  Dyspnea.   CONSULTING PHYSICIAN:  Arturo Morton. Riley Kill, MD, [facility]   HISTORY OF PRESENT ILLNESS:  This is a pleasant African-American 70-year-  old male who presented on May 02, 2008, for a chief complaint of  progressive dyspnea which had slowly worsened over the course of 1-2  months.  An initial chest x-ray obtained on May 04, 2008, demonstrates  bibasilar air space disease and effusion.  Initial cardiac evaluation,  for which she was brought to [facility], demonstrates  noncritical diabetic-appearing LAD, severe coronary artery disease  secondary to diabetes, significant diastolic dysfunction, and ejection  fraction currently approximately 60% by echocardiogram.  Of note, a  right heart cath was obtained demonstrating pulmonary artery occlusion  pressures measured at 22, with pulmonary artery systolic pressure of 37.  [name] has a longstanding history of chronic renal instability, poorly  controlled hypertension, and peripheral vascular disease.  Per the  patient, his dyspnea has improved since presentation.  Upon presentation  to [facility], he reported resting dyspnea and shortness of  breath with minimal activity.  At baseline, he reports he can ambulate  approximately 15 feet with his prosthetics in place prior to onset of  shortness of breath.  Upon time of pulmonary evaluation, he reports his  level of dyspnea close to baseline; however, did have acute onset of  dyspnea this morning for which he was awoken abruptly from.  He also  reports  progressive shortness of breath following blood transfusion the  night preceding evaluation.  Mr. Kundrat denies cough, he denies fever,  chills, sick exposures, he has an occasional wheeze with exertion.  Although he does not have lower extremities, he does report abdominal  bloating, and sensation of decreased appetite.  He also reports  occasional orthopnea.  This was worse upon time of presentation, and has  been on and off at times.  He denies pleuritic-type chest pain or any  chest pain in general, he has had no hemoptysis, no weight loss, no  nausea, vomiting, and no other pertinent positives.  The pulmonary  critical care team was asked to evaluate [name] to help explain  further the etiology of his dyspnea.  Treatment to date has consisted of  diuresis, blood transfusions, and blood pressure control with Imdur,  Catapres, labetalol, and diltiazem as well as Benicar.  Of note, I see  his systolic blood pressure still remains in the 170s to 180s.  Hematology consult has been obtained for underlying anemia, nephrology  consul is underway for chronic renal insufficiency, again we have been  asked to evaluate for his degree of dyspnea.  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   ALLERGIES:  No known drug allergies.   HOME MEDICATIONS:  1. Lipitor 10 mg daily.  2. Imdur 30 mg b.i.d.  3. Clonidine 0.3 mg t.i.d.  4. Labetalol 300 t.i.d.  5. Metformin 1000 q.a.m. 500 q.p.m.  6. Benicar 40 daily.  7. Benazepril 40 daily.  8. Aspirin 81 mg daily.  9. Triamterene/hydrochlorothiazide 37.5/25 daily.  10.Diltiazem CD 300 daily.  11.Insulin 70/30 15 units q.a.m. 15 q.p.m.   CURRENT MEDICATION LIST:  1. Ventolin 2.5 p.r.n.  2. Aspirin 81 daily.  3. Catapres 0.3 p.o. t.i.d.  4. Diltiazem  300 daily.  5. Furosemide 40 IV.  6. Hydralazine 75 p.o. t.i.d.  7. NovoLog sliding scale insulin.  8. Imdur 60 mg p.o. b.i.d.  9. Labetalol 300 p.o. t.i.d.  10.P.r.n. nitroglycerin.  11.Zocor 20 mg every p.m.  12.P.r.n. Tylenol.  13.P.r.n. Xanax.  14.P.r.n. Imodium.  15.P.r.n. Zofran.   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  He denies any fumes, mental exposure, or noxious inhalation.  He lives  in [place], originally from [place].  He has had no exotic travels, no  birds, his air conditioning system is well maintained.  He has never  smoked, denies illicit drug use, reports he takes his medications as  prescribed.   FAMILY HISTORY:  Hypertension and diabetes.  He denies history of COPD  or cancers.   REVIEW OF SYSTEMS:  Per HPI.   PHYSICAL EXAM:  VITAL SIGNS:  Temperature 97, heart rate 70, blood  pressure 170/77, the 90s/80s, saturations 98-100% on 2 liters nasal  cannula.  GENERAL:  This is an obese 70 year old African-American male patient  with bilateral below-the-knee amputation.  Currently in no acute  distress; however, does develop wheezing, and dyspnea with minimal  exertion.  HEENT:  Normocephalic, no JVD.  His mucous membranes are moist.  He has  no adenopathy.  PULMONARY:  Notable for crackles bibasilar, with expiratory wheeze.  CARDIAC:  Soft systolic murmur without radiation.  ABDOMEN:  Distended, with tympanic percussion, he has positive bowel  sounds, he does report feeling the sensation of feeling bloated.  GU:  Voiding clear yellow urine.  NEUROLOGIC:  Intact.   LABORATORY DATA:  Sodium 138, potassium 401, chloride 107, CO2 24, BUN  32, creatinine 2.45, glucose 97, white blood cell count 7.7, hemoglobin  9.7, hematocrit 29, and platelet count 285.   IMPRESSION AND PLAN:  Dyspnea secondary to large bilateral pleural  effusions, bilateral pulmonary edema, plus/minus some degree of  compression atelectasis, complicated by  decreased abdominal compliance  in the setting of a probable ileus.  A bilateral bedside ultrasound was  evaluated by myself and Dr. Molli Knock at the bedside demonstrating large  fluid collections in both the right and left pleural space.  Therefore,  plan at this point will be to obtain noncontrasted CT to better evaluate  parenchymal involvement, as well as evaluate bilateral effusions,  additionally would strongly recommend pushing diuresis as blood pressure  will tolerate, would optimize cardiac function specifically blood  pressure control as noted above.  Blood pressure is currently in the  170s to 180s on current regimen.  Following CT and an optimal diuresis,  we will consider both therapeutic and diagnostic thoracentesis primarily  to assist with issues of dyspnea, however to rule out other pulmonary  pathologies.  At this point, I suspect his dyspnea is primarily in the  setting of the above noted which is a complication of his diastolic  dysfunction, and decompensated heart failure.   Thank you for the opportunity to evaluate Mr. Sensabaugh, we will continue to  follow with you.      Zenia Resides, NP      Marius Ditch Jefm Miles, MD  Electronically Signed    PB/MEDQ  D:  05/09/2008  T:  05/10/2008  Job:  [id number]   cc:   Arturo Morton. Riley Kill, MD, [facility]

## 2011-02-10 NOTE — Assessment & Plan Note (Signed)
[facility]                          [facility] OFFICE NOTE   JAMISON, SOWARD                        MRN:          [id number]  DATE:08/09/2008                            DOB:          1941-06-02    PRIMARY CARDIOLOGIST:  Jonelle Sidle, MD   REASON FOR VISIT:  Scheduled followup.  Please refer to Dr. Ival Bible  recent office note of October 13, for complete details.   Clinically, Mr. Ozment continues to do well since his most recent  hospitalization at [facility] this past August.  He denies any  significant exertional dyspnea, PND, orthopnea, or chest pain.  He is  compliant with his medications, does not smoke, and refrains from added  salt in his diet.   Mr. Glatfelter is scheduled to follow up with Dr. Elvis Coil on August 22, 2008, for scheduled followup.   CURRENT MEDICATIONS:  Unchanged from most recent office visit.   PHYSICAL EXAMINATION:  VITAL SIGNS:  Blood pressure 160/70, pulse 60 and  regular, weight 188 (up one).  GENERAL:  A 70 year old male, sitting upright, no distress.  HEENT:  Normocephalic, atraumatic.  NECK:  Palpable carotid pulses without bruits; no JVD at 90 degrees.  LUNGS:  Diminished breath sounds in bases with no crackles or wheezes.  HEART:  Regular rate and rhythm.  No significant murmurs.  No rubs.  ABDOMEN:  Soft, intact bowel sounds.  EXTREMITIES:  Bilateral BKA with prosthesis in place.  NEUROLOGIC:  Flat affect, but no focal deficit.   IMPRESSION:  1. Chronic diastolic heart failure, currently euvolemic.  2. Multivessel coronary artery disease, treated medically.      a.     Status post cardiac catheterization, August 2009.  3. Preserved left ventricular function.  4. Status post bilateral pleural effusions.      a.     Status post bilateral thoracentesis, August 2009.  5. Chronic kidney disease.      a.     Normal bilateral renal arteries by recent ultrasonography.  6. Insulin-dependent  diabetes mellitus.  7. Hypertension.  8. Chronic normocytic anemia.  9. Dyslipidemia.  10.Mild mitral regurgitation.  11.Peripheral vascular disease.      a.     Status post bilateral below-knee amputations.   PLAN:  1. Up titrate Lipitor to 20 mg daily, for more aggressive lipid      management.  Recommendation is for a target LDL of 70 or less, if      possible.  The patient will then need follow up FLP/LFT profile in      12 weeks.  2. Schedule return clinic follow up with myself and Dr. Diona Browner in 4      months.      Rozell Searing, PA-C  Electronically Signed      Jonelle Sidle, MD  Electronically Signed   GS/MedQ  DD: 08/09/2008  DT: 08/10/2008  Job #: [id number]   cc:   Garnetta Buddy, M.D.  Doreen Beam, MD

## 2011-02-10 NOTE — Assessment & Plan Note (Signed)
[facility]                          [facility] OFFICE NOTE   ISAIS, KLIPFEL                        MRN:          [id number]  DATE:05/02/2008                            DOB:          [birth year]    PRIMARY CARE PHYSICIAN:  Doreen Beam, M.D.   REASON FOR VISIT:  Progressive shortness of breath.   HISTORY OF PRESENT ILLNESS:  Mr. [name] is a 70 year old gentleman I last  saw as an inpatient consult at [facility] back in April  of 2008.  He has a history of type 2 diabetes mellitus, hypertension,  hyperlipidemia, and bilateral below-the-knee amputations with no  previously documented history of coronary artery disease or myocardial  infarction.  In the setting of renal insufficiency with a creatinine of  2.4 back in 2008, he was noted to have minimal increase in troponin I  levels and our plan at that time was for noninvasive risk stratification  including an echocardiogram and an adenosine Myoview.  He had normal  left ventricular systolic function at that time and an overall  relatively low-risk Cardiolite demonstrating possible anterobasal  ischemia without definitive electrocardiographic changes.  He was  subsequently seen in our office after discharge and was doing reasonably  well on medical therapy.  We have not seen him since that time.   Dr. Sherril Croon spoke with Dr. Myrtis Ser yesterday by phone to arrange an urgent  follow-up visit today with me.  Mr. Gutridge has been experiencing  progressive shortness of breath with decreasing levels of activity over  the last 1-2 months, although denies having any frank chest discomfort.  He has had no shortness of breath at rest.  His functional capacity has  been declining and he has already undergone up titration of his medical  therapy including an increase in Imdur to 30 mg p.o. b.i.d. from once  daily by Dr. Sherril Croon.  This has not significantly altered symptomatology.  What was not discussed is  the fact that the patient continues to have  evidence of renal insufficiency with a creatinine of 2.3.  He is also  anemic with hemoglobin of 9.2 and hospital chart review shows that  during a hospital admission in February of this year for issues  unrelated to his cardiac status, he was noted to have minimally abnormal  troponin I levels up to 0.14.  Electrocardiogram today in the office  shows sinus rhythm with nonspecific ST-T wave changes.   He was referred today to specifically discuss a diagnostic cardiac  catheterization and I had a very frank discussion with the patient and  his wife particularly as it relates to his risk for contrast-induced  nephropathy, with potential worst case scenario being a need for long  term hemodialysis.  We discussed the issue that pursuing noninvasive  testing at this point would be unlikely to help [place] in any substantial  way.  It would likely be abnormal and we will still be left with the  question of whether revascularization would be an option or not.  We  could try and obtain better control of symptoms  through medication  adjustments, although his regimen is fairly broad at this time already.  After reviewing the potential risks and benefits, including in  particular contrast nephropathy, the patient is in agreement to proceed.  My feeling is that we should admit him to the hospital the day before  his anticipated cardiac catheterization so that we can adequately  hydrate him, hold metformin, diuretics and ACE inhibitor therapy  temporarily, and follow up lab work.   ALLERGIES:  No known drug allergies.   MEDICATIONS:  At present include Lipitor 10 mg p.o. daily, Imdur 30 mg  p.o. b.i.d., clonidine 0.3 mg p.o. t.i.d., labetalol 300 mg p.o. t.i.d.,  metformin 1000 mg p.o. q.a.m. and 500 mg p.o. q.p.m., Benicar 40 mg p.o.  daily, benazepril 40 mg p.o. daily, aspirin 81 mg p.o. daily,  triamterene/hydrochlorothiazide 37.5/25 mg p.o. daily,  diltiazem CD 300  mg p.o. daily, insulin 70/30 15 units subcu q.a.m. and subcu q.p.m.   PAST MEDICAL HISTORY, SOCIAL HISTORY AND FAMILY HISTORY:  Have been  reviewed previously.  Please see history of present illness.  Of note,  there is no history of tobacco use.  Functionally despite his bilateral  below-the-knee amputations, Mr. Monestime with the prostheses, has been  relatively functional.   REVIEW OF SYSTEMS:  As outlined above.  No bleeding problems.  No frank  orthopnea, PND, palpitations or syncope.  Otherwise negative.   EXAMINATION:  Blood pressure 160/70, heart rate 60 and regular, weight  is 200 pounds.  Oxygen saturation 95% on room air.  This is a overweight  male, seated, in no acute distress.  HEENT:  Conjunctivae are normal.  Pharynx clear.  Poor dentition.  NECK:  Supple.  No elevated jugular venous pressure.  No loud carotid  bruits.  No thyromegaly.  LUNGS:  Clear.  Nonlabored breathing at rest.  CARDIAC EXAM:  Reveals a regular rate and rhythm, soft systolic murmur  and no S3 gallop or pericardial rub.  ABDOMEN:  Soft, nontender.  Normoactive bowel sounds.  EXTREMITIES:  Status post bilateral below-the-knee amputations with  prostheses in place.  SKIN:  Warm and dry otherwise.  MUSCULOSKELETAL:  No kyphosis noted.  NEUROPSYCHIATRIC:  The patient is alert and oriented x3.  Affect seems  appropriate.   IMPRESSION/RECOMMENDATIONS:  1. Progressive dyspnea on exertion, concerning for anginal equivalent      in a patient with presumed underlying cardiovascular disease based      on an overall low risk but mildly abnormal Cardiolite from 2008.      He clearly has risk factors for porgressive obstructive coronary      disease.  His electrocardiogram is nonspecific at this point.  He      does have renal insufficiency with a creatinine in mid July of 2.3      and is therefore at risk for contrast nephropathy.  This was      discussed in detail today with the patient and  his wife.  Our plan      is admission to [facility] on the day prior to planned      cardiac catheterization so that we might initiate adequate      hydration, hold diuretics, hold angiotensin-converting enzyme      inhibitor, and hold metformin.  We would anticipate a coronaries      only procedure with limited contrast as left ventricular function      can be assessed otherwise echocardiographically.  The main question  is whether there are any clear revascularization strategies that      may help him symptomatically and prognostically.  Otherwise his      medical regimen is fairly broad.  2. Renal insufficiency.  Presumably this is related to a combination      of diabetes and hypertension over the years, although renal artery      stenosis has not been specifically investigated as best I can tell.      This is obviously a consideration, although would not approach this      angiographically at least by conventional methods at this time      given his risk      for contrast nephropathy.  3. Further plans to follow pending above information.     Jonelle Sidle, MD  Electronically Signed    SGM/MedQ  DD: 05/02/2008  DT: 05/02/2008  Job #: [id number]   cc:   Doreen Beam, MD

## 2011-02-10 NOTE — Assessment & Plan Note (Signed)
[facility]                          [facility]ICE NOTE   JEFFREN, DOMBEK                        MRN:          [id number]  DATE:01/28/2009                            DOB:          1940-12-04    PRIMARY CARE PHYSICIAN:  Doreen Beam, MD   NEPHROLOGIST:  Garnetta Buddy, MD   REASON FOR VISIT:  Scheduled followup.   HISTORY OF PRESENT ILLNESS:  Mr. Allsbrook was seen back in November with  history of chronic diastolic heart failure and multivessel  cardiovascular disease that is being medically managed at this time.  He  had documentation of calcified coronary arteries with total occlusion of  the right coronary artery, severely diseased AV groove circumflex, and  otherwise scattered mild luminal irregularities.  Echocardiography  demonstrates a left ventricular ejection fraction of 60% with  pseudonormal filling pattern and mild mitral regurgitation.  He is not  reporting any significant angina at this time and has NYHA class II  dyspnea on exertion.  He is status post previous thoracentesis last  August and has subsequent documentation of normal renal artery duplex  scans with chronic renal insufficiency being followed closely by Dr.  Hyman Hopes.  Lab work from April showed a BUN and creatinine of 57 and 3.2  with a GFR of 23.  His hemoglobin was 12.1 at that time.  Present  medical regimen as outlined below.  He is not reporting any orthopnea or  PND.   ALLERGIES:  No known drug allergies.   PRESENT MEDICATIONS:  1. Imdur 60 mg p.o. b.i.d.  2. Diltiazem CD 360 mg p.o. daily.  3. Potassium chloride 20 mEq p.o. daily.  4. Torsemide 20 mg 3 tablets p.o. b.i.d.  5. Vitamin D supplements.  6. Lipitor 20 mg p.o. daily.  7. Clonidine 0.3 mg p.o. t.i.d.  8. Labetalol 3 mg p.o. t.i.d.  9. Aspirin 81 mg p.o. daily.  10.Insulin 70/30 13 units subcu q.a.m. and subcu q.p.m.  11.Hydralazine 100 mg p.o. q.6 h.  12.Pantoprazole 40 mg p.o. daily.  13.Stool softener p.r.n.   REVIEW OF SYSTEMS:  As detailed above.  The patient reports a stable  appetite.  He continues to ambulate with prosthesis status post prior  bilateral below-the-knee amputations.  He also uses a cane.  He has had  no falls.  Otherwise, review of systems is negative.   PHYSICAL EXAMINATION:  VITAL SIGNS:  The patient blood pressure  initially checked 191/76, rechecked by me 142/72, heart rate is 69,  respirations 18, weight is 182 pounds which is down 6 pounds from his  last visit.  GENERAL:  He is a chronically ill-appearing male in no acute distress.  HEENT:  Conjunctivae are normal.  Oropharynx is clear.  Poor dentition.  NECK:  Supple.  No elevated jugular venous pressure.  No loud bruits or  thyromegaly.  LUNGS:  Clear.  Diminished breath sounds at the bases.  No wheezing.  CARDIAC:  Regular rate and rhythm.  Soft apical systolic murmur.  No  pericardial rub.  PMI is indistinct.  ABDOMEN:  Soft and nontender.  EXTREMITIES:  Prior bilateral below-knee amputations with prosthesis in  place.  MUSCULOSKELETAL:  No kyphosis noted.  NEUROPSYCHIATRIC:  The patient is alert x3.   IMPRESSION AND RECOMMENDATION:  1. 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  Mr. Jasmin is      not reporting any significant angina and has NYHA class II dyspnea      on exertion at this time.  His weight is down 6 pounds from his      last visit.  We will plan to continue his present cardiac regimen      and I will see him back over the next 6 months.  2. Hyperlipidemia, on Lipitor.  The patient is due for followup lipid      profile and liver function tests which will be arranged.  His goal       LDL should be around 70.  3. Chronic renal insufficiency followed by Dr. Hyman Hopes.     Jonelle Sidle, MD  Electronically Signed    SGM/MedQ  DD: 01/28/2009  DT: 01/29/2009  Job #: [id number]   cc:   Garnetta Buddy, M.D.  Doreen Beam, MD

## 2011-02-10 NOTE — Consult Note (Signed)
NAME:  [name], VORHEES NO.:  [id number]   MEDICAL RECORD NO.:  [id number]          PATIENT TYPE:  INP   LOCATION:  2007                         FACILITY:  [facility]   PHYSICIAN:  Maree Krabbe, M.D.DATE OF BIRTH:  [birth year]   DATE OF CONSULTATION:  DATE OF DISCHARGE:                                 CONSULTATION   CONSULT REQUESTED BY:  Arturo Morton. Riley Kill, MD, [facility].   CHIEF COMPLAINT:  Shortness of breath.   HISTORY OF PRESENT ILLNESS:  Mr. Rollo is a 70 year old man with coronary  artery disease, hypertension, hyperlipidemia, type 2 diabetes mellitus,  CVA, peripheral vascular disease complicated bilateral below-knee  amputation, and chronic anemia.  The patient presents with progressive  shortness of breath of about 75-month duration.  It was associated with  orthopnea, but no chest pain, cough, fever, palpitations, or swelling of  extremities.  The patient says he thinks he has been having some  tightness of abdomen, but denies any weight gain or change in appetite  or change in bowel or bladder habits or jaundice.   ALLERGIES:  No known allergies.   PAST MEDICAL HISTORY:  1. Chronic kidney disease.  Creatinine 1.7 in May 2005.  At this time,      he admitted with creatinine of 2.2.  2. Coronary artery disease.  Catheterization done on May 07, 2008,      that showed pulmonary hypertension, 100% RCA stenosis, severe      circumflex involvement and multivessel involvement with diastolic      dysfunction.  3. Hypertension, poorly controlled.  4. Peripheral vascular disease.  5. Type 2 diabetes mellitus with A1c of 5.7.  6. Chronic anemia.  7. Bilateral below knee amputation.  8. Hyperlipidemia.   CURRENT MEDICATIONS:  1. Apresoline 75 mg 3 times a day.  2. Aranesp 100 mcg given 1 time.  3. Aspirin 81 mg.  4. Catapres 0.3 mg 3 times a day.  5. Ferrous sulfate 325 mg twice a day.  6. Imdur 60 mg b.i.d.  7. Lovenox 40 mg subcutaneous once a day.  8.  Normodyne 300 mg 3 times a day.  9. NovoLog sliding scale.  10.Cardizem 300 mg.  11.Zocor 20 mg once a day.  12.Ambien 5 mg once a day.  13.Imodium p.r.n.  14.Maalox p.r.n.  15.Milk of magnesia p.r.n.  16.Robitussin p.r.n.  17.Tylenol p.r.n.  18.Ventolin p.r.n.  19.Xanax p.r.n.  20.Zofran p.r.n.  21.The patient also received few doses of Lasix and has been placed on      p.o. Lasix 40 mg twice a day from today.   SOCIAL HISTORY:  Lives with his wife, the patient is a retired.  He does  not have a history of tobacco, alcohol, or drug use.   FAMILY HISTORY:  Positive for hypertension and diabetes.   REVIEW OF SYSTEMS:  Negative for fever or chills.  Positive for fatigue.  Positive for shortness of breath and orthopnea.  Negative for polyuria.  Negative for frequency and urgency.  Negative for nausea, vomiting, or  diarrhea.   PHYSICAL EXAMINATION:  VITAL SIGNS:  Temperature 98.4, pulse 64,  respiratory rate 22, blood pressure 164/70, and oxygen saturation 98% on  2 liters.  GENERAL:  NAD.  HEAD, EYE, and ENT:  Normocephalic and atraumatic.  Muddy sclera.  Moist  mucous membranes, smooth tongue.  NECK:  Supple.  With supple JVD.  CARDIOVASCULAR:  Regular rate and rhythm with normal heart sounds.  CHEST:  Clear to auscultation bilaterally with few crackles bibasilarly.  SKIN:  No rash.  ABDOMEN:  Mild distention.  No rebound or guarding with normal bowel  sounds.  EXTREMITIES:  Edema 2+ over thigh.  NEURO: Alert and oriented x3.  Grossly nonfocal.   LABORATORY DATA:  1. A CT scan, May 09, 2008, results pleural effusion, asymmetric      airspace disease suggestive of pneumonia along with parapneumonic      effusion.  2. Chest x-ray, May 10, 2008, left effusion with bibasilar      atelectasis and infiltrates.  3. Urinalysis, May 09, 2008 small LE, few bacteria, and protein      more than 300.  WBC 7-10, specific gravity 1.024, granular cast      present.  4.  Pleural fluid aspiration suggestive of exudative nature with LDH of      63 and protein less than 3.  5. CBC, hemoglobin 9.1, WBC 6.9, and platelets 282.  6. BMET, sodium 140, potassium 4, chloride 108, bicarbonate 25, BUN      33, creatinine 2.39, blood glucose 61.  7. LFT, bilirubin 0.7, AST 19, ALT 18, alkaline phosphatase 81,      albumin 2.1, and protein 5.5.  8. Miscellaneous.  SPEP/UPEP negative for M-protein, iron 15, percent      saturation 6, ferritin 81. Normal B12 and folate.   ASSESSMENT AND PLAN:  This is a 70 year old man with hypertension,  hyperlipidemia, chronic kidney disease, coronary artery disease, severe  peripheral vascular disease presenting with progressive shortness of  breath.  1. Chronic kidney disease.  The patient has significant risk factors      of diabetes, hypertension, peripheral vascular disease, and      hyperlipidemia.  The patient's kidney disease is currently at stage      III.  We recommend good diabetic and blood pressure control at this      stage.  It appears that patient is maintaining good output, but      again clinically is very much fluid overloaded.  Recommend changing      to IV diuretics at this point and try to diurese.  2. Shortness of breath.  This is a combination of heart failure,      coronary artery disease, anemia, and pleural effusion.  We      recommend getting a 2-D echocardiogram and management of heart      failure per Cardiology.  3. Proteinuria.  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  4. Anemia.  This is likely related to chronic kidney disease.  A      colonoscopy can be considered as an outpatient if not already done.      We agree with continuing ferrous sulfate.  The patient can be      considered for the shots of Aranesp.  5. Diabetes.  The patient A1c is 5.7%.  We recommend continuing with      sliding scale.  6. Hypertension.  The patient is currently on Cardizem, hydralazine,       Catapres, Normodyne, as well as  Imdur.  We recommend close      monitoring.  The patient could probably benefit from addition of      ACE inhibitors after acute issue with renal function is resolved.      Zara Council, MD  Electronically Signed      Maree Krabbe, M.D.  Electronically Signed    AS/MEDQ  D:  05/11/2008  T:  05/12/2008  Job:  [id number]

## 2011-02-10 NOTE — Letter (Signed)
July 03, 2009   Charlaine Dalton. Sherene Sires, MD, FCCP  [address]  [place] [address]   Re:  Harston, MIRACLE CRIADO                 DOB:  June 25, 1941   Dear Kathlene November,   The patient was recently in the hospital.  You apparently have seen the  patient several times in the past and he was recently in the hospital at  [facility] with abdominal pain and had a right thoracentesis and  was seen by Dr. Orson Aloe.  Apparently, he has had three thoracentesis  for recurrent right pleural effusion.  He has multiple medical problems  including chronic renal insufficiency, hypertension, diabetes mellitus,  coronary artery disease.  He has had a progressive shortness of breath.  His creatinine was 3.25 and his baseline is 2.3.  He is referred here  for recurrent right pleural effusion.  A chest x-ray was done and shows  a moderately large right pleural effusion.  Thoracentesis revealed that  this with a transudate which would go along with his renal failure.   MEDICATIONS:  Include Lipitor, hydralazine, acetaminophen, Mylanta,  Phenergan, milk of magnesia, stool softeners, potassium, Protonix,  clonidine, insulin, Xanax, labetalol, oxazepam, Demadex, and isosorbide.   He has no allergies.   He has had previously bilateral BKAs.  He has peripheral vascular  disease, diabetes, hypertension, coronary artery disease and renal  failure.   FAMILY HISTORY:  Positive for lung cancer and emphysema.   SOCIAL HISTORY:  He is a nonsmoker.  He comes in today with his wife.  He has three children.  Does not drink alcohol.   REVIEW OF SYSTEMS:  GENERAL:  He is 190 pounds, 5 feet 9 inches.  His  weight has been stable.  CARDIAC:  He has shortness of breath with exertion.  No recent angina or  atrial fibrillation.  PULMONARY:  No hemoptysis or asthma.  GI:  See history of present illness.  GU:  See past medical history.  VASCULAR:  No claudication, DVT, or TIAs.  See past medical history.  NEUROLOGIC:  No  dizziness, headaches, blackouts, or seizures.  MUSCULOSKELETAL:  Arthritis.  PSYCHIATRIC:  No depression or nervousness.  EYE/ENT:  No changes in his eyesight or hearing.  HEMATOLOGIC:  No problems with bleeding, clotting disorders, or anemia.   PHYSICAL EXAMINATION:  General:  He is a well-developed [place]  male in no acute distress.  Vital Signs:  His blood pressure is 158/68,  pulse 68, respirations 18, sats were 92%.  Head, Eyes, Ears, Nose and  Throat:  Unremarkable.  Neck:  Supple without thyromegaly.  There is no  supraclavicular or axillary adenopathy.  Chest:  Clear to auscultation  and percussion.  Heart:  Regular sinus rhythm.  No murmurs.  Abdomen:  Soft.  There is no hepatosplenomegaly.  Neurologic:  He is oriented x3.  Sensory and motor intact.  Cranial nerves intact.  Extremities:  Pulses  are 2+.  There is no clubbing or edema.  He does have bilateral BKAs  that are well healed.   I feel that is the best treatment for the patient is a right Pleurx  catheter, I have tentatively scheduled this for July 16, 2009.  Hopefully, we can get his lung expanded and then do a talc pleurodesis  after a week or two of drainage.  If you have any thoughts, please let  me know.  I appreciate the opportunity of seeing the  patient.   Sincerely,   Ines Bloomer, M.D.  Electronically Signed   DPB/MEDQ  D:  07/03/2009  T:  07/04/2009  Job:  [id number]   cc:   Doreen Beam, MD  Elise Benne, M.D.

## 2011-02-10 NOTE — Consult Note (Signed)
NAME:  [name], CASO NO.:  [id number]   MEDICAL RECORD NO.:  [id number]          PATIENT TYPE:  INP   LOCATION:  2007                         FACILITY:  [facility]   PHYSICIAN:  Eduard Clos, MDDATE OF BIRTH:  [birth year]   DATE OF CONSULTATION:  05/09/2008  DATE OF DISCHARGE:                                 CONSULTATION   Medical consultation called by cardiology for management of diabetes  mellitus, type 2.   CHIEF COMPLAINT:  Patient was admitted for dyspnea on exertion.   HISTORY OF PRESENT ILLNESS:  Patient is a 70 year old male with known  history of diabetes mellitus, type 2, history of hypertension, chronic  kidney disease, hyperlipidemia, was admitted to the hospital because of  persistent dyspnea on exertion and for further management.  Patient has  had a cardiac catheterization and, as per cardiology, medical management  is being planned.  Patient still has shortness of breath and easily gets  short of breath with minimal exertion.  Denies any chest pain, denies  any palpitations, loss of consciousness, denies any weakness of limbs,  abdominal pain, nausea, vomiting or diarrhea.  Patient's blood sugars  have been fairly well-controlled with sliding scale and insulin aspart  protamine, aspart 70/30 15 units every 12 hours with CBGs running around  156, 139 and 136.   PAST MEDICAL HISTORY:  Hypertension, diabetes mellitus type 2, chronic  kidney disease, anemia of chronic kidney disease, hyperlipidemia,  peripheral vascular disease.   PAST SURGICAL HISTORY:  Bilateral below-the-knee amputations.   MEDICATIONS:  1. Patient presently is on albuterol nebulizer.  2. Aspirin 81 mg p.o. daily.  3. Clonidine 0.2 mg p.o. three times daily.  4. Diltiazem 300 mg p.o. daily.  5. Lasix  40 mg IV p.r.n.  6. Hydralazine 75 mg p.o. three times a day.  7. Insulin sliding scale:  Insulin aspart protamine/aspart 15 units      subcu every 12.  8. Imdur 60 mg  p.o. twice daily.  9. Labetalol 300 mg p.o. three times a day.  10.Morphine sulfate as needed.  11.Nitroglycerin.  12.Simvastatin 20 mg p.o. daily.   The patient's Benicar, Glucophage, which patient is taking at home, has  been placed on hold.   ALLERGIES:  No known drug allergies.   SOCIAL HISTORY:  Patient denies smoking cigarettes, drinks alcohol  occasionally, denies any illegal drug abuse.   FAMILY HISTORY:  Significant for hypertension and diabetes mellitus,  type 2.   REVIEW OF SYSTEMS:  As in History of Present Illness, nothing else  significant.   PHYSICAL EXAM:  Patient examined at bedside, not in acute distress.  VITAL SIGNS:  Blood pressure is 170/77, pulse 70 per minute, temperature  97, respirations 18 per minute, O2 sat 100% on room air.  HEENT:  Anicteric.  No pallor.  CHEST:  Bilateral air entry present,  bilateral expiratory wheeze heard,  no crepitation.  HEART:  S1, S2 heard.  ABDOMEN:  Slightly distended, soft, bowel sounds, no guarding, no  rigidity.  CNS:  Patient alert, awake, oriented to time, place and person.  EXTREMITIES:  Peripheral pulses felt.  There is bilateral below-the-knee  amputation.  No edema.   LABORATORY DATA:  On May 09, 2008, WBC is 7.7, hemoglobin 9.7,  hematocrit 29.1, platelets 285.  Basic metabolic panel:  Sodium 138,  potassium 4.1, chloride 107, bicarb 24, BUN 32, creatinine 2.4, glucose  97, calcium 8.5.  Chest x-ray on May 05, 2008, shows poor lung  aeration plus small effusion and left basilar atelectasis.  Two-D echo  done on May 04, 2008, shows LVH with EF of 50%, diastolic  dysfunction.   ASSESSMENT:  1. Diabetes mellitus, type 2, which is fairly well-controlled with      present regimen.  2. Acute diastolic heart failure.  3. Uncontrolled hypertension.  4. Chronic kidney disease.  5. Anemia, probably from chronic kidney disease.  6. Hyperlipidemia.   PLAN:  We will continue the present regimen, which is  insulin aspart  protamine/aspart 70/30 15 units every 12 hours with sliding scale.  We  will discontinue metformin due to chronic kidney disease.  May start  Januvia at time of discharge 15 mg.  We will check hemoglobin A1c.   Thank you for involving [place] in patient's care.  We will continue to  follow.      Eduard Clos, MD  Electronically Signed     ANK/MEDQ  D:  05/09/2008  T:  05/09/2008  Job:  [id number]

## 2011-02-10 NOTE — Letter (Signed)
August 20, 2009   Doreen Beam, MD  [address]  [place], [place] [address]   Re:  [name], [name]                 DOB:  [birth year]   Dear Dr. Sherril Croon;   I saw the patient here, after we removed his Pleurx and put in talc, he  still sees some reaction in the right base, but this appears to be  stable.  He is breathing well overall.  His blood pressure was 134/89,  pulse 70, respirations 20, sats were 98%.  I will see him back again in  6 weeks with a chest x-ray for a final check.  His lungs are clear to  auscultation and percussion.   Ines Bloomer, M.D.  Electronically Signed   DPB/MEDQ  D:  08/20/2009  T:  08/21/2009  Job:  [id number]

## 2011-02-10 NOTE — Assessment & Plan Note (Signed)
[facility]                          [facility] OFFICE NOTE   [name], [name]                        MRN:          [id number]  DATE:05/31/2008                            DOB:          1941/03/03    CARDIOLOGIST:  [name] Sidle, MD   PRIMARY CARE PHYSICIAN:  [name] Beam, MD   REASON FOR VISIT:  Post hospitalization followup.   HISTORY OF PRESENT ILLNESS:  [name] is a 70 year old male patient who  saw [name] in followup on May 02, 2008.  He was referred for  further evaluation of progressive dyspnea.  The situation was quite  complicated given his history of chronic kidney disease.  It was  eventually decided to hold his diuretics, metformin and ACE inhibitor  and proceed with cardiac catheterization.  He was brought in for  overnight hydration prior to his procedure.  His cardiac catheterization  was done by [name] August 10.  The cardiac catheterization  demonstrated moderate pulmonary hypertension.  It also demonstrated  significant coronary calcifications and diabetic vessels.  He had a  totally occluded RCA with left-right collaterals, a heavily calcified  LAD but no significant stenoses, fairly heavily diseased ramus  intermediate and AV circumflex.  He had a 60% ostial stenosis in the  ramus intermediate and 60-70% mid stenosis and an ostial 80% stenosis in  the AV circumflex which was a very small caliber vessel.  Echocardiogram  demonstrated that his EF was 60% with moderate LVH and a moderate sized  left pleural effusion was noted.  He had mild mitral regurgitation.  The  patient was noted to be anemic.  His hemoglobin was as low as 8.4.  It  was felt that his anemia was likely secondary to chronic disease but he  was seen by hematology.  Of note, the suggestion of proceeding with  erythropoietin injections was made.  This could be administered by his  primary care physician or nephrologist.  He was also seen by  nephrology.  Medication adjustments were made.  ACE inhibitor and ARB were  discontinued.  It was felt that he may need dialysis at some point in  the future.  Followup was recommended with nephrology.  The patient was  also seen by pulmonology secondary to bilateral pleural effusions.  He  underwent bilateral thoracenteses.  According to the discharge notes,  his fluid was essentially unremarkable.  He did have followup blood work  post discharge.  His creatinine is stable at 2.3 by a BMET done on  August 24.   In the office today the patient notes that he feels much better.  He  actually states that he feels back to baseline.  He does have bilateral  below the knee amputations.  He does get around on his prosthetic limbs  quite well.  He denies any significant shortness of breath with  exertion.  He denies any chest pain.  He denies orthopnea or PND.  He  denies any syncope.   CURRENT MEDICATIONS:  Isosorbide mononitrate ER 60 mg b.i.d., Diltiazem  360 mg daily, Lipitor 10  mg daily, Clonidine 0.3 mg three times a day,  Labetalol 300 mg three times a day,  Aspirin 81 mg daily, Insulin 70/30, Hydralazine 50 mg 2 tablets q.6  hours, Torsemide 20 mg 3 tablets b.i.d., Pantoprazole 40 mg daily, K-Dur  20 mEq daily,  Stool softener p.r.n.   ALLERGIES:  No known drug allergies.   PHYSICAL EXAM:  GENERAL:  He is a well-nourished, well-nourished male.  VITAL SIGNS:  Blood pressure is 149/59, pulse 57, weight 182 pounds.  HEENT:  Normal.  NECK:  Without JVD at 90 degrees.  CARDIAC:  Normal S1, S2.  Regular rate and rhythm.  No S3.  LUNGS:  With decreased breath sounds bilaterally.  No egophony.  No  rales.  ABDOMEN:  Soft, nontender.  EXTREMITIES:  Reveal bilateral below knee amputations.  His thighs are  not edematous.  VASCULAR:  Right femoral arteriotomy site without hematoma.  No  pulsatile mass noted.  He does have a soft bruit noted.  The left  femoral artery is also auscultated  and is notable for a soft bruit.   ASSESSMENT AND PLAN:  1. Multifactorial dyspnea.  The patient has chronic diastolic      congestive heart failure with recent evidence of pulmonary edema      and pleural effusions treated with thoracenteses.  This was in the      setting of profound anemia as well as significant coronary disease      and renal insufficiency.  He is now improved and he feels his      breathing is back to baseline.  He does not appear to be      significantly volume overloaded on the exam.  I have recommended      that we proceed with a chest x-ray to reassess his pleural      effusions.  We will also check blood work to include a BMET, CBC      and BNP level.  2. Coronary artery disease as outlined above.  This seems to be      stable.  He is not having any chest pain.  He will continue on      medical therapy.  3. Chronic kidney disease.  He sees [name] next week.  The patient      also has bilateral renal artery Dopplers set up for tomorrow to      rule out significant renal artery stenosis.  The patient continues      to have elevated blood pressures.  He is somewhat maxed out on most      of his medications.  He is now off of his angiotensin receptor      blocker and his ACE inhibitor.  I suppose reinitiation of his ACE      inhibitor could be considered.  However, I would leave this up to      [name] expertise.  4. Diabetes mellitus.  He will need continued followup for aggressive      control with [name].  5. Peripheral arterial disease.  He does have bruits noted over his      bilateral femoral arteries.  I think his cath site looks stable and      I do not think further testing is warranted at this time.  He has      not had any symptoms regarding this.  6. Dyslipidemia.  He will continue on Lipitor.  7. Anemia.  As noted, a follow up CBC has  been ordered.  He sees Dr.      Hyman [name] next week.  There was some discussion during his      hospitalization  regarding erythropoietin injections.  This will be      per [name] or his primary care physician.   DISPOSITION:  The patient will be brought back in followup with Dr.  Diona [name] in the next 6 weeks.  He knows to return sooner p.r.n.      Tereso Newcomer, PA-C  Electronically Signed      Learta Codding, MD,FACC  Electronically Signed   SW/MedQ  DD: 05/31/2008  DT: 05/31/2008  Job #: [id number]   cc:   Garnetta Buddy, M.D.  [name] Beam, MD

## 2011-02-10 NOTE — Letter (Signed)
August 07, 2009   Doreen Beam, MD  [address]  [place], [place] [address]   Re:  [name], [name]                 DOB:  1941-02-28   Dear Dr. Sherril Croon:   I saw the patient today.  He is draining on Monday and Friday.  He has  drained 160 mL each time.  We are going to drain him again this Friday,  then I plan to remove his Pleurx next Monday, and we will do a talc  pleurodesis at that time.  I appreciate the opportunity of seeing the  patient.   Sincerely,   Ines Bloomer, M.D.  Electronically Signed   DPB/MEDQ  D:  08/07/2009  T:  08/08/2009  Job:  [id number]

## 2011-02-10 NOTE — Discharge Summary (Signed)
NAME:  FINCH, COSTANZO NO.:  [id number]   MEDICAL RECORD NO.:  [id number]          PATIENT TYPE:  INP   LOCATION:  2007                         FACILITY:  [facility]   PHYSICIAN:  Noralyn Pick. Eden Emms, MD, FACCDATE OF BIRTH:  04-04-1941   DATE OF ADMISSION:  05/03/2008  DATE OF DISCHARGE:  05/15/2008                         DISCHARGE SUMMARY - REFERRING   ADDENDUM   The amendment to the prior discharge dictation includes the following:   Mr. Keneth Borg at the time of discharge on 05/16/2008 he was also  discharged on aspirin 81 mg daily.      Joellyn Rued, PA-C      Noralyn Pick. Eden Emms, MD, [facility]  Electronically Signed    EW/MEDQ  D:  06/08/2008  T:  06/08/2008  Job:  [id number]   cc:   Learta Codding, MD,FACC  Hyman Hopes, M.D.  Leighton Roach Truett Perna, M.D.  Doreen Beam, MD

## 2011-02-10 NOTE — Letter (Signed)
July 23, 2009   Doreen Beam, MD  [address]  [place], [place]  [address]   Re:  [name], [name]                 DOB:  1941/04/22   Dear Dr. Sherril Croon:   I saw the patient today and we drained him again.  He had 400 on Friday  and we drained out at least 400 today.  I put him on a Monday, Thursday  schedule.  We will see him back again in 2 weeks with his Pleurx  catheter.  At that time, we will get another chest x-ray.  I appreciate  the opportunity of seeing the patient.   Sincerely,   Ines Bloomer, M.D.  Electronically Signed   DPB/MEDQ  D:  07/23/2009  T:  07/24/2009  Job:  [id number]

## 2011-02-10 NOTE — Cardiovascular Report (Signed)
NAME:  NYQUAN, SELBE NO.:  [id number]   MEDICAL RECORD NO.:  [id number]          PATIENT TYPE:  INP   LOCATION:  2007                         FACILITY:  [facility]   PHYSICIAN:  Arturo Morton. Riley Kill, MD, FACCDATE OF BIRTH:  [birth year]   DATE OF PROCEDURE:  05/07/2008  DATE OF DISCHARGE:                            CARDIAC CATHETERIZATION   INDICATION:  Mr. Turgeon is a 70 year old gentleman with progressive  shortness of breath.  The patient has insulin-dependent diabetes  mellitus.  It is severe with previously demonstrated important vascular  disease.  The current study was done to demonstrate coronary anatomy.  Importantly, the patient has renal insufficiency and has been  appropriately hydrated and the plan has been for coronaries only.  The  current study was done to assess further anatomy.   PROCEDURES:  1. Right and left heart catheterizations.  2. Coronary arteriography.   DESCRIPTION OF THE PROCEDURE:  The patient was brought to the  catheterization laboratory and prepped and draped in the usual fashion.  We used a Smart needle to gain access to the right femoral vein.  A 7-  French thermodilution Swan-Ganz catheter was then inserted through a 7-  [place] sheath, and superior vena cava saturation obtained.  Serial right  heart pressures were recorded.  Wedge pressure was obtained.  Pulmonary  artery saturation was obtained.  Thermodilution cardiac outputs were  performed.  Following this, the right femoral artery was entered and a 5-  [place] sheath was placed.  We placed a pigtail in the left ventricle.  Simultaneous wedge LV were obtained.  Pullback was obtained.  The  pigtail was removed.  The right heart catheter was removed.  We were  able to obtain right and left ventricle simultaneous pressures to  exclude restrictive cardiomyopathy.  Following this, coronary  arteriography was performed using Judkins catheters.  A 29 mL of  contrast was used only.  There  were no major complications and he was  taken to the holding area in satisfactory clinical condition.  I then  reviewed the data with his family.   Of note, the patient has significant renal insufficiency and they were  on the understanding that there were some risks involved.  The patient  was also noted to be anemic.  This has been followed closely in the  hospital.   HEMODYNAMIC DATA:  1. Right atrial pressure 12.  2. RV 41/12.  3. Pulmonary artery of 37/22, mean 27.  4. Pulmonary capillary wedge 22.  5. LV 150/22.  6. Aortic 150/55, mean 85.  7. Thermodilution cardiac output 5.4 L/min.  8. Thermodilution cardiac index 2.5 L/min per sq m.  9. Superior vena cava saturation 66%.  10.Pulmonary artery saturation 60%.  11.Aortic saturation 94%.  12.There was no RV or LV equilibration.  13.Aortic pullback appeared to be without significant gradient.   ANGIOGRAPHIC DATA:  1. As previously noted, no ventriculography was performed, also no      aortic root aortography was performed.  2. There was moderately heavy calcification involved in coronary      arteries, which were  visible on plain fluoroscopy.  3. The right coronary artery was severely diseased with small caliber      vessel, diabetic in appearance, totally occluded, and with bridging      collaterals to the distal vessel.  There were left-to-right      collaterals, but the terminal vessels appeared to be severely small      and not suitable for bypass.  4. The left main is without critical disease.  5. The LAD is fairly heavily calcified.  It has a diabetic appearance,      and appears to be somewhat diffusely diseased.  However, there is      not specific high-grade focal narrowing.  Fortunately, this vessel      courses to the apex.  One would estimate that the vessel would be      somewhat larger in caliber in normal state, but again no isolated      focal stenosis is noted.  6. The ramus intermedius is a calcified  vessel, particularly at the      ostium.  It is hypodense and probably represents a 60% ostial      narrowing, although could only be determined by intravascular      ultrasound.  There is probably 60-70% mid-narrowing.  The AV      circumflex is a very small-caliber vessel, being pencil like and      typical of diabetes.  There is 80% ostial narrowing and the vessel      appears to be severely and diffusely diseased.   CONCLUSIONS:  1. Moderate pulmonary hypertension with elevated left ventricular end-      diastolic pressure and wedge pressure.  2. Calcified coronaries with total occlusion of the right coronary      artery, severely diseased A-V circumflex and scattered      irregularities elsewhere as noted above.  3. Anemia, possibly secondary to underlying renal disease.   RECOMMENDATIONS:  1. Workup to the anatomy would be helpful and possible appropriate      replacement therapy as needed for renal insufficiency.  It would be      helpful to try to raise the hemoglobin to 11 or 12.  2. The current findings are not suitable for bypass nor bypass would      be helpful.  Likewise, the ramus intermedius could be approached,      but does not appear to be necessarily the primary problem.  I would      lean toward medical therapy at this time.  3. I believe the dyspnea is likely a combination of CAD, anemia, and      diastolic dysfunction.  4. We should rule out aortic regurgitation by echocardiography.  This      is currently not in the chart.      Arturo Morton. Riley Kill, MD, [facility]  Electronically Signed     TDS/MEDQ  D:  05/07/2008  T:  05/08/2008  Job:  [id number]   cc:   Arturo Morton. Riley Kill, MD, [facility]  Cardiovascular Laboratory  Jonelle Sidle, MD  Doreen Beam, MD

## 2011-02-10 NOTE — Assessment & Plan Note (Signed)
[facility]                          [facility] NOTE   NAME:[name]                        MRN:          [id number]  DATE:07/10/2008                            DOB:          1941-08-08    PRIMARY CARE PHYSICIAN:  Doreen Beam, MD.   REASON FOR VISIT:  Routine cardiac followup.   HISTORY OF PRESENT ILLNESS:  [name] was seen back in September.  His  history is well detailed in [name] previous note.  Symptomatically, [name] is not reporting any angina and has NYHA class  II dyspnea on exertion at this time with his typical levels of activity.  He has renal insufficiency that is to be followed up by Dr. Hyman Hopes over  the next month, and he is also on Epogen injections every 2 weeks to the  clinic in [place].  Recent labs on June 28, 2008, demonstrated a  BUN and creatinine of 42 and 2.8, potassium of 3.4.  Potassium  supplements have been adjusted and he otherwise continues on diuretic  therapy with a history of decompensated diastolic heart failure and  pleural effusions.  He reports a stable weight, although I see that this  is up approximately 5 pounds from his last visit.  He is not having any  orthopnea or PND.  No palpitations or syncope.   ALLERGIES:  No known drug allergies.   PRESENT MEDICATIONS:  1. Imdur 60 mg p.o. b.i.d.  2. Diltiazem 360 mg p.o. daily.  3. Potassium chloride 20 mEq one-half tablet p.o. daily.  4. Torsemide 40 mg p.o. b.i.d.  5. Lipitor 10 mg p.o. daily.  6. Clonidine 0.3 mg mg p.o. t.i.d.  7. Labetalol 300 mg p.o. t.i.d.  8. Aspirin 81 mg p.o. daily.  9. Insulin 70/30 as directed.  10.Hydralazine 50 mg 2 tablets p.o. q.6 h.  11.Pantoprazole 40 mg p.o. daily.   REVIEW OF SYSTEMS:  As per in the history of present illness. Otherwise,  negative.   PHYSICAL EXAMINATION:  VITAL SIGNS:  Blood pressure is 142/61, heart  rate is 56, and weight is 187 pounds.  GENERAL:  Somewhat chronically  ill-appearing male in no acute distress.  NECK:  No elevated jugular venous pressure.  No loud bruits.  LUNGS:  Exhibit diminished breath sounds throughout, particularly at the  right base.  Probably some mild residual effusion in this area.  CARDIAC:  Regular rate and rhythm, soft systolic murmur.  No S3 gallop.  ABDOMEN:  Soft and nontender.  EXTREMITIES:  Exhibit bilateral below-the-knee amputations.   IMPRESSION AND RECOMMENDATIONS:  1. Coronary artery disease outlined at cardiac catheterization in      August 2009 demonstrating an occluded right coronary artery with      left-to-right collaterals, heavily calcified left anterior      descending without significant obstructive stenosis, and a heavily      calcified ramus intermedius and circumflex with 60% ostial stenosis      noted in the ramus intermedius and a 60-70% mid stenosis with 80%      stenosis of the ostial circumflex.  These were small caliber      vessels and managed medically at that time, and overall ejection      fraction was 60% with moderate left ventricular hypertrophy.  Mr.      [name] has manifested diastolic heart failure and at this point our      plan is medical therapy aimed at volume and blood pressure control,      as well as management of ischemia.  He seems to be relatively      stable at this point and is due to see Dr. Hyman Hopes for further      management of renal insufficiency.  He has no clear evidence of      renal artery stenosis based on Dopplers done in September.  Kidney      size was described as normal and symmetric with normal renal      arteries bilaterally.  I did not make any specific medication      changes today.  He does need a followup BMET for review of his      renal function and potassium.  2. Hyperlipidemia, continues on statin therapy.  Goal LDL should be      around 70.     Jonelle Sidle, MD  Electronically Signed    SGM/MedQ  DD: 07/10/2008  DT: 07/11/2008  Job #:  [id number]   cc:   Garnetta Buddy, M.D.

## 2011-02-10 NOTE — Consult Note (Signed)
NAME:  [name], [name] NO.:  [id number]   MEDICAL RECORD NO.:  [id number]          PATIENT TYPE:  INP   LOCATION:  2007                         FACILITY:  [facility]   PHYSICIAN:  Leighton Roach. Truett Perna, M.D. DATE OF BIRTH:  03-08-1941   DATE OF CONSULTATION:  05/08/2008  DATE OF DISCHARGE:                                 CONSULTATION   REFERRING PHYSICIAN:  Arturo Morton. Riley Kill, MD, [facility]   PATIENT IDENTIFICATION:  Mr. [name] is a 70 year old admitted for  evaluation of shortness of breath.  He was found to have coronary artery  disease on a cardiac catheterization, May 07, 2008.  We are consulted  to evaluate anemia.   HISTORY OF PRESENT ILLNESS:  [name] has a history of diabetes and  hypertension.  He reports an approximate 85-month history of progressive  exertional dyspnea.  He denies associated symptoms.  He presented to the  [facility] office for evaluation on May 02, 2008.  He was  admitted to [facility] to rule out significant coronary artery disease.   He reports a history of anemia.  He is followed by Dr. Sherril Croon.  He is  not aware of undergoing a hematologic evaluation in the past.   PAST MEDICAL HISTORY:  1. Diabetes.  2. Hypertension.  3. Chronic renal insufficiency.  4. History of anemia.   PAST SURGICAL HISTORY:  1. Status post left BKA in 1981.  2. Status post right BKA in 1999.   MEDICATIONS ON ADMISSION:  1. Lipitor 10 mg daily.  2. Imdur 30 mg b.i.d.  3. Clonidine 0.3 mg t.i.d.  4. Labetalol 300 mg t.i.d.  5. Metformin 1000 mg q.a.m. and 500 mg q.p.m.  6. Benicar 40 mg daily.  7. Benazepril 40 mg daily.  8. Aspirin 81 mg daily.  9. Triamterene/hydrochlorothiazide 37.5/25 daily.  10.Diltiazem CD 300 mg daily.  11.Insulin 70/30 15 units q.a.m. and 15 units q.p.m.   ALLERGIES:  No known drug allergies.   FAMILY HISTORY:  He is not aware of any family history of cancer or  anemia.   SOCIAL HISTORY:  He lives with his wife in [place].  He is  retired after  working in a [employer].  He does not use tobacco.  He previously  used alcohol on the weekend and quit in 1998.  He was transfused at the  time of an amputation procedure.  He denies risk factors for HIV and  hepatitis.   REVIEW OF SYSTEMS:  CONSTITUTIONAL:  Negative.  RESPIRATORY:  Exertional  dyspnea prior to hospital admission, otherwise negative.  CARDIAC:  Negative.  GU:  Negative.  GI:  Negative.  NEUROLOGIC:  Negative.  HEMATOLOGIC:  Negative.  INFECTIOUS DISEASE:  Negative.  SKIN:  Negative.   PHYSICAL EXAMINATION:  VITAL SIGNS:  Blood pressure 148/68, pulse 60,  temperature 97, and oxygen saturation 99%.  HEENT:  No thrush.  LUNGS:  Bilateral end expiratory wheeze.  CARDIAC:  Regular rhythm.  ABDOMEN:  Protuberant.  No hepatosplenomegaly.  EXTREMITIES:  Status post bilateral below-the-knee amputation.  NEUROLOGIC:  He is alert and oriented.  LYMPH NODES:  No palpable cervical, clavicular, axillary, or inguinal  nodes.   LABORATORY DATA:  From May 03, 2008, hemoglobin 8.6, platelets  261,000, white count 6.4, and MCV 94.5.  BUN 31, creatinine 2.2, total  protein 5.7, albumin 2.3, and calcium 8.2.  Labs from May 08, 2008,  BUN 28, creatinine 2.19, and hemoglobin 8.4.  Labs from Feb 07, 2004,  hemoglobin 13.4, platelets 236,000, white count 7.1, and MCV 95.2.   Review of the peripheral blood smear from today - the white cell  morphology is unremarkable.  The platelets appear normal in number.  The  polychromasia is not increased.  There are a few burr cells, teardrop  forms, and helmet cells. ? Rare bite cells.   IMPRESSION:  1. Normocytic anemia.  2. Chronic renal insufficiency.  3. Diabetes.  4. Hypertension.  5. Coronary artery disease.  6. Status post bilateral below-the-knee amputation.  7. Admission with exertional dyspnea - ? related to coronary artery      disease, anemia, or a primary pulmonary process.   [name] was referred for  evaluation of anemia.  He appears to have a  chronic history of anemia.  We do not have outpatient records from [place]  available today.   My initial impression is the anemia is most likely related to chronic  renal insufficiency.  The differential diagnosis includes a primary  hematologic condition such as myelodysplasia, a hematopoietic  malignancy, or an infiltrative bone marrow process.   He does not have other symptoms to suggest the presence of a malignancy.   We will perform an initial hematologic evaluation while he is in the  hospital.  We will arrange for outpatient followup at the [facility].      Leighton Roach Truett Perna, M.D.  Electronically Signed     GBS/MEDQ  D:  05/08/2008  T:  05/09/2008  Job:  [id number]   cc:   Arturo Morton. Riley Kill, MD, [facility]  Doreen Beam, MD

## 2011-02-10 NOTE — Discharge Summary (Signed)
NAME:  [name], PRUDE NO.:  [id number]   MEDICAL RECORD NO.:  [id number]          PATIENT TYPE:  INP   LOCATION:  2007                         FACILITY:  [facility]   PHYSICIAN:  Noralyn Pick. Eden Emms, MD, FACCDATE OF BIRTH:  May 15, 1941   DATE OF ADMISSION:  05/03/2008  DATE OF DISCHARGE:  05/15/2008                         DISCHARGE SUMMARY - REFERRING   DISCHARGE DIAGNOSES:  1. Acute on chronic diastolic heart failure.  2. Coronary artery disease.  3. Hypertension.  4. Normocytic anemia requiring transfusion.  5. Chronic kidney disease, stage 3.  6. Proteinuria.  7. Hyperglycemia with a history of diabetes.   PROCEDURES PERFORMED:  Cardiac catheterization by Dr. Riley Kill on May 07, 2008.  Again noted transcriptions please list the above.   BRIEF HISTORY:  [name] is a 70 year old male who was admitted to  [facility] and Dr. Diona Browner was asked to consult in regard to  progressive shortness of breath with decreased provocation over the  preceding 2 months.  He denied any associated chest discomfort or at  rest shortness of breath.  Patient was also noted to have increasing  problems with renal insufficiency and anemia.  Specifically at [facility],  it was discussed in regard to a cardiac catheterization to further  evaluate his ongoing symptoms.  The patient will be a high risk given  his renal insufficiency and potential problems with contrast  nephropathy.  In anticipation, he was hydrated.  Metformin diuretics,  and ACE inhibitors were held.   PAST MEDICAL HISTORY:  Notable for:  1. Type 2 diabetes.  2. Hypertension.  3. Hyperlipidemia.  4. Bilateral below-the-knee amputations.  5. Renal insufficiency with a creatinine of 2.4 in 2008.  6. Echocardiogram and adenosine Myoview within the last year that has      showed preserved LV function.   LABORATORY DATA:  At [facility] on May 04, 2008, weight was 86.7  kg.  On the day of discharge, May 15, 2008, his weight was 18 kg.  On  May 03, 2008, H&H was 8.6 and 26.5, normal indices, platelets 261,  WBCs 6.4.  Last CBC on May 14, 2008, showed an H&H of 9.9 and 30.0,  normal indices, platelets 288, WBCs 7.9.  PTT was 21, PT 14.  On May 08, 2008, percent retic was 1.3, RBCs were low at 2.83, and absolute  retic was 36.8.  On May 03, 2008, sodium was 135, potassium 4.6, BUN  31, creatinine 2.2, glucose 120, normal LFTs.  Protein and albumin were  low at 5.7 and 2.3, calcium 8.2.  On May 15, 2008, the day of  discharge sodium was 141, potassium 3.3, BUN 40, creatinine 2.50,  glucose 184, calcium 8.6, phosphorus 4.7.  hemoglobin A1c May 09, 2008, was 5.7.  On May 03, 2008, initial CK-MB was 110 and 3.6,  troponin 0.28, BNP 523.  Subsequent CK-MB patterns were unremarkable.  Troponins subsequently were 0.28 and 0.30.  Fasting lipids on May 04, 2008, showed a total cholesterol 141, triglycerides 84, HDL 41, LDL 83.  TSH on May 03, 2008, was  2.946.  BNP on May 14, 2008, was 300.  Iron studies on May 08, 2008, showed an iron level that was low at  15, TIBC at 264, percent sat low at 6.  Vitamin B12, serum folate, and  ferritin were within normal limits.  Specific protein view showed a  total protein that was low at 5.5, IgA elevated at 432.  IgG, IgM and  his C3-C4 components were within normal limits.  Erythropoietin was  normal at 19.9.  Protein electrophoresis showed a serum albumin that was  low at 43.4.  Alpha-1 and alpha-2 were elevated at 8.0 and 18.3  respectively.  Beta-2 was also elevated at 6.7.  Pleural aspiration was  essentially unremarkable except for a slightly elevated LDH.  Urinalysis  was positive for ketones, blood, protein, and trace leukocytes, however,  pleural fluid cultures were unremarkable.  Chest x-ray on May 04, 2008, was limited, small bilateral pleural effusions with probable  congestive heart failure, bibasilar air space disease.   Chest CT on  May 09, 2008, continued to show bilateral pleural effusions, air  space disease, most compatible with pneumonia and parapneumonic  effusions, massive cardiomegaly.  Renal ultrasound was performed on  May 11, 2008, and did not show any evidence of hydronephrosis.  Last  chest x-ray on May 14, 2008, showed improving pulmonary edema and  bilateral pleural effusions.  EKGs on May 04, 2008, showed sinus  bradycardia, normal axis, delayed R-wave, nonspecific ST-T wave changes.   HOSPITAL COURSE:  Dr. Diona Browner made arrangements and transferred Mr.  [name] to [facility] for further evaluation and consider of  cardiac catheterization.  Dr. Myrtis Ser reviewed the patient and felt that  his creatinine was stable and catheterization should proceed.  However,  due to patient's continued orthopnea, catheterization was not performed  immediately.  Echocardiogram was performed on May 04, 2008, however,  at the time of this dictation the echocardiogram results are pending in  the computer.  He remained off his ACE, ARBs, diuretics, and metformin.  Given this, his blood pressure increased and hydralazine was added.  Catheterization was performed on May 07, 2008, by Dr. Riley Kill, please  refer to dictation.  Dr. Riley Kill felt that he had severe diabetic  coronary artery disease, however, circumflex and RCA were not felt to be  bypassable and LAD was not critical.  Dr. Riley Kill felt his dyspnea was  secondary to multiple factors such as coronary artery disease, anemia,  diastolic dysfunction.  Echocardiogram report was actually found in the  chart and showed an EF of 60%, moderate LVH, diastolic dysfunction, mild  MR, left atrial enlargement, small pericardial effusion, moderate-sized  left pleural effusion, moderate LVH.  Given his continuing anemia and on  May 08, 2008, dropping to 8.4, patient received 2 units of packed  RBCs.  Hematology was consulted on May 08, 2008,  with the above-  mentioned testing.  Patient continued to have periods of hypertension.  Medications continued to be adjusted.  Internal medicine with [facility] was consulted in regards to his diabetes.  They agreed with  discontinuing metformin given his chronic kidney disease and suggested  Januvia 50 mg at discharge.  Pulmonary also saw the patient.  Diuresis  ensued.  Pulmonary performed thoracentesis on May 10, 2008, given his  pleural effusions.  Fluid was essentially unremarkable.  On May 11, 2008, renal consult was performed given his renal insufficiency who  agreed with current treatment and felt that at some  point he probably  would need dialysis.  By May 12, 2008, post thoracentesis patient's  shortness of breath had improved significantly.  Discharge planning was  begun.  However, on May 12, 2008, rapid response was summoned due to  respiratory difficulty while receiving transfusion.  Additional diuresis  ensued.  Dr. Truett Perna with hematology felt that the patient should  receive EPO that could be administered by his primary care physician or  the renal physician; however, they were willing to arrange in [facility] if  need be.  Pulmonary said that they would arrange outpatient followup and  by May 15, 2008, Dr. Eden Emms felt this patient was ready for  discharge, renal concurred.   DISPOSITION:  Patient is discharged home on May 15, 2008.  Asked to  maintain low-sodium, heart-healthy ADA diet.  Wound care is not  applicable at this point.  Activities were not restricted.   NEW MEDICATIONS:  Include:  1. Demadex 60 mg b.i.d.  2. Potassium 20 mEq daily.  3. Imdur was increased to 60 mg b.i.d.  4. Iron 325 mg b.i.d.  5. Hydralazine 100 mg every 6 hours.   OTHER MEDICATIONS THAT HE WAS ASKED TO CONTINUE:  Include:  1. Lipitor 10 mg q.h.s.  2. Clonidine 0.3 t.i.d.  3. Labetalol 300 mg t.i.d.  4. Diltiazem CD 300 mg daily.   He was asked not to take his  metformin, Benicar, benazepril.  He will  have renal Dopplers in the [facility] office on June 01, 2008, at  9:00 a.m.  He will have blood work in 1 week with a BMET in the [place]  office.  We will call him with those arrangements to follow up on his  kidney function.  He was asked to maintain a blood pressure diary and to  bring all medications and his blood pressure diary to all appointments.  He will follow up with Dr. Andee Lineman in [place] on May 31, 2008, at 2:30.  He was asked to call Dr. Sherril Croon for an appointment to follow up with his  diabetes.  Recommendations were made for Januvia to help control his  sugars as that Glucophage was discontinued secondary to his renal  function.  He will follow up with Dr. Hyman Hopes on Tuesday, June 05, 2008, at 3:45.   DISCHARGE TIME:  50 minutes.      Joellyn Rued, PA-C      Noralyn Pick. Eden Emms, MD, [facility]  Electronically Signed    EW/MEDQ  D:  05/15/2008  T:  05/15/2008  Job:  [id number]   cc:   Learta Codding, MD,FACC  Garnetta Buddy, M.D.  Leighton Roach. Truett Perna, M.D.  Doreen Beam, MD

## 2011-02-10 NOTE — Letter (Signed)
October 02, 2009   Doreen Beam, MD  [address]  [place], [place]  [address]   Re:  [name], [name]                 DOB:  1941-02-17   Dear Dr. Sherril Croon;   I saw the patient in the office today.  His incision is well healed  where he had a Pleurx.  His blood pressure was 149/69, pulse 68,  respirations 18, sats were 96%.  I will plan to see him back again in 4  weeks with chest x-ray.  Now his chest x-ray showed scarring and some  minimal fluid in the right base, so we are planning to see her back  again in 6 and now it has been 6 weeks since we removed the Pleurx.  I  will be happy to see him again if he has any future problems.    Sincerely,   Ines Bloomer, M.D.  Electronically Signed   DPB/MEDQ  D:  10/02/2009  T:  10/03/2009  Job:  [id number]

## 2011-02-13 NOTE — Op Note (Signed)
NAME:  [name], [name]                           ACCOUNT NO.:  [id number]   MEDICAL RECORD NO.:  [id number]                   PATIENT TYPE:  AMB   LOCATION:  DAY                                  FACILITY:  [facility]   PHYSICIAN:  Dennie Maizes, M.D.                DATE OF BIRTH:  Feb 10, 1941   DATE OF PROCEDURE:  02/11/2004  DATE OF DISCHARGE:                                 OPERATIVE REPORT   PREOPERATIVE DIAGNOSES:  Voiding difficulty, urethral stricture, recurrent  balanitis.   POSTOPERATIVE DIAGNOSES:  Voiding difficulty, urethral stricture, recurrent  balanitis, bladder neck stenosis.   OPERATION/PROCEDURE:  Cystoscopy, urethral dilation, transurethral resection  of bladder neck stenosis, circumcision.   ANESTHESIA:  Spinal.   SURGEON:  Dr. Rito Ehrlich.   COMPLICATIONS:  None.   ESTIMATED BLOOD LOSS:  Minimal.   DRAINS:  16 French Foley catheter in the bladder.   INDICATIONS FOR PROCEDURE:  This 70 year old male has undergone TURP in  January of 2004.  He has a lot of voiding difficulty associated with  troublesome urinary frequency and nocturia.  He also had recurrent  balanitis.  He was taken to the operating room today for cystoscopy,  possible urethral dilation and circumcision.   DESCRIPTION OF PROCEDURE:  Spinal anesthesia was induced and the patient was  placed on the operating room table in the dorsal lithotomy position.  The  lower abdomen and genitalia were prepped and draped in a sterile fashion.  Cystoscopy was attempted with a 20 [place] scope.  Sub meatal urethral  stricture was noted and the scope could not be introduced into the bladder.  Dilation of the urethral stricture was done up to 26 [place].  Cystoscopy was  done and the stricture was found to be adequately dilated.  There was  evidence of previous TURP and the prostatic urethra was opened.  A moderate  degree of blood and stenosis was noted.  The appearance of blood was  unremarkable.   The urethra was  dilated up to 30 [place] with [employer] sound.  A 28 French  Iglesias resectoscope with continuous bladder irrigation was then inserted  into the bladder.  The stricture was incised with the knife electrode at 2  and 10 o'clock positions.  The bladder neck was then opened.  The scar  tissue was then resected circumferentially using a loupe electrode.  There  was no active bleeding.  The instruments were removed.   The patient was reprepped and draped in a sterile fashion and placed in the  supine position.  He had recurrent balanitis with phimosis.  The foreskin  was clamped at 6 and 12 o'clock positions with straight hemostats.  Dorsal  and ventral slits were made.  The redundant foreskin was then excised.  The  edges of the foreskin were then  approximated using 3-0 chromic.  A 16 French Foley catheter was then  inserted into the  bladder.  Vaseline gauze dressing and Coban were applied  to the penis.  The estimated blood loss was minimal.  The patient was  transferred to the PACU in a satisfactory condition.      ___________________________________________                                            Dennie Maizes, M.D.   SK/MEDQ  D:  02/11/2004  T:  02/11/2004  Job:  [id number]   cc:   Doreen Beam  [address]  [place]  [place] [address]  Fax: [phone]

## 2011-02-13 NOTE — Op Note (Signed)
NAME:  [name], [name]                           ACCOUNT NO.:  [id number]   MEDICAL RECORD NO.:  [id number]                   PATIENT TYPE:  AMB   LOCATION:  DAY                                  FACILITY:  [facility]   PHYSICIAN:  R. Roetta Sessions, M.D.              DATE OF BIRTH:  09-Dec-1940   DATE OF PROCEDURE:  07/17/2002  DATE OF DISCHARGE:                                 OPERATIVE REPORT   PROCEDURE:  Diagnostic colonoscopy.   INDICATION FOR PROCEDURE:  The patient is a 70 year old gentleman with a six-  month history of crescendo constipation, and he has to take either magnesium  citrate or Miralax to have a bowel movement; otherwise he goes five days  without a bowel movement in contrast to his premorbid bowel habit frequency  of one bowel movement daily.  He has not had any rectal bleeding, etc.  He  has never had his lower GI tract evaluated.  Colonoscopy is now being done  to further evaluate his symptoms.  The approach has been discussed with the  patient, the potential risks, benefits, and alternatives have been reviewed  and any questions answered.  I feel he is at low risk for conscious  sedation.  Please see my dictated H&P for more information.   DESCRIPTION OF PROCEDURE:  O2 saturation, blood pressure, pulse, and  respirations were monitored throughout the entire procedure.  Conscious  sedation of Versed 3 mg IV and Demerol 75 mg IV in incremental doses.  Ampicillin 2 g IV, gentamicin 120 mg IV for SBE prophylaxis.   Instrument:  Olympus video chip adult colonoscope.   FINDINGS:  Digital rectal examination revealed no abnormalities.   Endoscopic findings:  The prep was good.  Rectum:  Examination of the rectal  mucosa including retroflexed view of the anal verge revealed no  abnormalities.   Colon:  The colon was surveyed from the rectosigmoid junction through he  left, transverse, right colon, the appendiceal orifice, ileocecal valve, and  cecum.  These structures  were well-seen and photographed for the record.  Colonic mucosa all the way to the cecum appeared normal.  He did have  somewhat of an elongated, tortuous colon; otherwise, the mucosa appeared  normal to the terminal ileum, ileocecal valve (see photos).  The scope was  slowly and cautiously withdrawn.  All previously-mentioned mucosal surfaces  were again seen, and again no abnormalities were observed.  The patient  tolerated the procedure well, was reactivated in endoscopy.   IMPRESSION:  1. Normal rectum.  2. Elongated, tortuous but otherwise normal-appearing colon.   RECOMMENDATIONS:  1. Will check a TSH and serum calcium today.  2. Continue Miralax 17 g daily to b.i.d. to facilitate bowel function until     we have labs back for review.   He may be a candidate for Zelnorm.   Further recommendations to follow.  Jonathon Bellows, M.D.    RMR/MEDQ  D:  07/17/2002  T:  07/17/2002  Job:  [id number]   cc:   Doreen Beam  [address]  [place]  [place] [address]  Fax: [phone]

## 2011-02-13 NOTE — H&P (Signed)
NAME:  [name], [name]                             ACCOUNT NO.:  [id number]   MEDICAL RECORD NO.:  [id number]                  PATIENT TYPE:   LOCATION:                                       FACILITY:   PHYSICIAN:  Dennie Maizes, M.D.                DATE OF BIRTH:   DATE OF ADMISSION:  10/15/2003  DATE OF DISCHARGE:                                HISTORY & PHYSICAL   CHIEF COMPLAINT:  Urinary retention, enlarged prostate.   HISTORY OF PRESENT ILLNESS:  This 70 year old male was referred to me by Dr.  Sherril Croon.  He had been having moderate prostatic obstructive symptoms for  several months.  He has slow urinary stream, urinary frequency x3-4 and  nocturia x2-3.  He denied having any dysuria, hematuria, or urolithiasis in  the past.  He developed inability to void about a month ago.  He went to the  emergency room at [facility].  Catheterization revealed a large  amount of post void residual urine.  The patient developed recurring voiding  difficulty and he had to be catheterized.  Cystoscopy was done in the office  which revealed benign prostatic hypertrophy with bladder neck obstruction.  Trial of voiding was done and the patient was able to void well for a few  days with Flomax therapy.  The patient developed recurrent urinary retention  and he is on Foley catheter drainage at present.  After counseling he is  brought to the day hospital today for transurethral resection of the  prostate.   PAST MEDICAL HISTORY:  1. Insulin-dependent diabetes mellitus.  2. Hypertension.  3. Status post bilateral below-knee amputations.   MEDICATIONS:  1. Zelnorm 0.6 mg one p.o. b.i.d.  2. Metformin hydrochloride 500 mg p.o. daily.  3. Lipitor 10 mg one p.o. each night.  4. Diltiazem 300 mg one p.o. daily.  5. Benicar 40 mg one p.o. b.i.d.  6. Maxzide 37.5/25 mg one p.o. daily.  7. Clonidine 0.2 mg one p.o. t.i.d.  8. Humulin 70/30 16 units subcutaneous q.a.m.   ALLERGIES:  None.   FAMILY HISTORY:  Positive for diabetes mellitus and hypertension.   PHYSICAL EXAMINATION:  VITAL SIGNS:  Height 5 feet 9 inches, weight 196  pounds.  HEENT:  Normal.  NECK:  No masses.  LUNGS:  Clear to auscultation.  HEART:  Regular rate and rhythm, no murmurs.  ABDOMEN:  Soft.  No palpable mass.  No CVA tenderness.  GENITALIA:  Penis uncircumcised.  Foley catheter is draining clear urine.  Testes are normal.  RECTAL:  A 40 gram size benign prostate.  EXTREMITIES:  Status post below-knee amputations.   IMPRESSION:  Acute urinary retention, benign prostatic hypertrophy with  bladder neck obstruction.   PLAN:  I discussed with the patient regarding management options.  He has  not responded to medical treatment.  He is scheduled to undergo  transurethral resection of the prostate  under anesthesia in day hospital.  I  have informed him regarding the diagnosis, operative details, alternative  treatments, outcome, possible risks and complications and he has agreed for  the procedure to be done.  He will be admitted to the hospital in the  postoperative period.     ___________________________________________                                         Dennie Maizes, M.D.   SK/MEDQ  D:  10/14/2003  T:  10/15/2003  Job:  [id number]   cc:   Doreen Beam  [address]  [place]  [place] [address]  Fax: [phone]

## 2011-02-13 NOTE — Consult Note (Signed)
NAME:  [name], [name] NO.:  [id number]   MEDICAL RECORD NO.:  [id number]                   PATIENT TYPE:   LOCATION:                                       FACILITY:   PHYSICIAN:  Gerrit Friends. Rourk, M.D.               DATE OF BIRTH:  [birth year]   DATE OF CONSULTATION:  DATE OF DISCHARGE:                                   CONSULTATION   REASON FOR CONSULTATION:  Severe constipation.   HISTORY OF PRESENT ILLNESS:  The patient is a 70 year old black gentleman  with history of diabetes mellitus, hypertension, hypercholesterolemia, on  multiple medications, who presents today at the request of Dr. Sherril Croon for  further evaluation of consultation.  The patient reports over the last six  months, he has been suffering from severe constipation.  Prior to this, he  had regular bowel movements.  Now, he only has a bowel movement with taking  magnesium citrate.  He will generally go 3-4 days without a bowel movement  and then takes magnesium citrate and has multiple stools.  He has been tried  on MiraLax for a couple of weeks, but did not notice any improvement.  He  has tried Crystallose for a couple of weeks as well as Colace with no noted  improvement.  He denies any melena, rectal bleeding, nausea, vomiting,  heartburn, dysphagia, or odynophagia.  His weight has been stable.  He  complains of associated abdominal bloating diffuse abdominal discomfort.  He  says Dr. Sherril Croon stopped his Norvasc a couple of weeks ago and started him on  Diltiazem and has noted no improvement of his constipation.  He reports his  sugars are well controlled, usually in the 100s.  He has never had a  colonoscopy.  He denies any family history of colorectal cancer.   CURRENT MEDICATIONS:  1. Humulin 70/30 at 60 units q.a.m.  2. Glucophage 500 mg b.i.d.  3. Lotensin 40 mg b.i.d.  4. Clonidine 0.2 mg b.i.d.  5. Diltiazem 240 mg q.d.  6. Triam/HCTZ 37.5/25 mg q.d.  7. Lipitor 10 mg  q.d.  8. Aspirin 81 mg q.d.  9. MiraLax 17 g q.d.  The patient has discontinued.  10.      Crystallose 20 mg q.d.  The patient has discontinued.  11.      Colace b.i.d.  The patient has discontinued.   ALLERGIES:  No known drug allergies.   PAST MEDICAL HISTORY:  Insulin-dependent diabetes mellitus.  Hypertension.  Hypercholesterolemia.   PAST SURGICAL HISTORY:  Bilateral below-knee amputations, right leg in 1981  and left leg in 1999.   FAMILY HISTORY:  Father died of diabetes mellitus and hypertension.  No  family history of chronic GI illnesses or colorectal cancer.   SOCIAL HISTORY:  Married for 37 years.  He has three children.  He is  disabled.  He has never been a  smoker.  He quit drinking alcohol in 1998.   REVIEW OF SYMPTOMS:  Please see HPI for GI and for general.  CARDIOPULMONARY:  Denies any heart disease, has hypertension and  hypercholesterolemia.  MUSCULOSKELETAL:  He has bilateral below-knee  amputations, but is able to ambulate with bilateral prostheses.   PHYSICAL EXAMINATION:  VITAL SIGNS:  Weight 197.5, height 5 feet 9 inches,  temperature 97.9, blood pressure 140/80, pulse 80.  GENERAL:  Pleasant well-nourished, well-developed black gentleman in no  acute distress.  SKIN:  Warm and dry, no jaundice.  HEENT:  Pupils equal, round, and reactive to light, conjunctivae are pink,  sclerae nonicteric.  Oropharyngeal mucosa moist and pink, no lesions,  erythema, or exudate.  NECK:  No lymphadenopathy or thyromegaly, or carotid bruits.  CHEST:  Lungs were clear to auscultation.  CARDIAC:  Reveals regular rate and rhythm, normal S1, S2.  A 2/6 systolic  ejection murmur heard best at the upper precordium.  ABDOMEN:  Protuberant, but symmetrical.  Positive bowel sounds.  Soft,  nontender, no hepatosplenomegaly.  He has some induration superficially in  the right mid abdomen where he gave previous insulin injections.  RECTAL:  Reveals adequate sphincter tone.  No  masses in the rectal vault.  Prostate is smooth, not enlarged, partial exam done because of positioning  of prostate.  Brown secretions are hemoccult negative.  EXTREMITIES:  No cyanosis or edema.   IMPRESSION:  [name] is a pleasant 70 year old gentleman who has a six-month  history of constipation.  He has tried multiple laxatives without much  benefit.  He does have response to magnesium citrate.  He is on multiple  medications which could be causing his constipation, including clonidine,  diltiazem, and HCTZ/Triam.  He has never had his colon imaged and therefore  recommend colonoscopy for further evaluation of constipation as well as  colorectal cancer screening.  We will check a thyroid and calcium level as  well.   PLAN:  1. TSH and calcium done at the time of endoscopy.  2. Colonoscopy, we will provide subacute bacterial endocarditis prophylaxis     given history of heart murmur.  3. Heart murmur detected on examination today.  We will leave further workup     per Dr. Sherril Croon.  4. He will take MiraLax 17 p.o. t.i.d. for the next two days and then will     take q.d. to b.i.d. as needed.  5. I discussed risks, alternatives, and benefits of colonoscopy with the     patient and he is agreeable to proceed.   I would like to thank Dr. Sherril Croon for allowing [place] to take part in the care of  this patient.     Tana Coast, P.A.                        Gerrit Friends. Rourk, M.D.    LL/MEDQ  D:  06/30/2002  T:  07/03/2002  Job:  [id number]

## 2011-02-13 NOTE — Op Note (Signed)
NAME:  [name], [name]                           ACCOUNT NO.:  [id number]   MEDICAL RECORD NO.:  [id number]                   PATIENT TYPE:  INP   LOCATION:  A218                                 FACILITY:  [facility]   PHYSICIAN:  Dennie Maizes, M.D.                DATE OF BIRTH:  May 30, 1941   DATE OF PROCEDURE:  DATE OF DISCHARGE:                                 OPERATIVE REPORT   PREOPERATIVE DIAGNOSES:  1. Urinary retention.  2. Benign prostatic hypertrophy with bladder neck obstruction.   POSTOPERATIVE DIAGNOSES:  1. Urinary retention.  2. Benign prostatic hypertrophy with bladder neck obstruction.   PROCEDURE:  Transurethral resection of the prostate.   SURGEON:  Dennie Maizes, M.D.   ANESTHESIA:  Spinal.   COMPLICATIONS:  None.   ESTIMATED BLOOD LOSS:  100 mL.   DRAINS:  38 French triple lumen Foley catheter with 30 cc balloon in the  bladder.   INDICATIONS FOR PROCEDURE:  This 70 year old male had developed recurrent  urinary retention due to benign prostatic hypertrophy causing bladder neck  obstruction. He was started on Flomax, but he failed to void.  After  counseling, he was brought to the day hospital today for transurethral  resection of the prostate under anesthesia.   DESCRIPTION OF PROCEDURE:  Spinal anesthesia was induced and the patient was  placed on the OR table in the dorsal lithotomy position.  The lower abdomen  and genitalia were prepped and draped in a sterile fashion.  The patient had  a tight phimosis and the foreskin was retracted. Cystoscopy was done with  the 20 [place] scope.  The urethra was normal.  There was moderate  hypertrophy involving both lateral lobes of the prostate with anatomical  obstruction of the bladder neck area.  The appearance of the bladder was  normal.  The cystoscope was then removed.   The urethra was dilated up to 30 [place] with [employer].  A 28 French  Iglesias resectoscope with continuous bladder irrigation was  then inserted  into the bladder.  The adenoma at the 6 o'clock position was resected first  up to the level of the verumontanum.  The right and left lateral lobes were  then resected up to the level of the capsule.  Finally resection was done in  the anterior midline area.  The prostatic chips were removed and sent for  histopathological examination.  The prostatic fossa was then closely  examined and complete hemostasis was obtained by cauterization.  The  instruments were removed.   A 22 French triple lumen Foley catheter with 30 cc balloon was then inserted  into the bladder.  Continuous bladder irrigation was started and the returns  are clear.  The estimated blood loss was about 100 mL.  The patient was  transferred to the PACU in a satisfactory condition.      ___________________________________________  Dennie Maizes, M.D.   SK/MEDQ  D:  10/15/2003  T:  10/15/2003  Job:  [id number]   cc:   Doreen Beam  [address]  [place]  [place] [address]  Fax: [phone]

## 2011-02-13 NOTE — Discharge Summary (Signed)
NAME:  [name], [name]                           ACCOUNT NO.:  [id number]   MEDICAL RECORD NO.:  [id number]                   PATIENT TYPE:  INP   LOCATION:  A319                                 FACILITY:  [facility]   PHYSICIAN:  Dennie Maizes, M.D.                DATE OF BIRTH:  [birth year]   DATE OF ADMISSION:  10/15/2003  DATE OF DISCHARGE:  10/17/2003                                 DISCHARGE SUMMARY   FINAL DIAGNOSES:  1. Benign prostatic hypertrophy with bladder neck obstruction.  2. Urinary retention.   OTHER DIAGNOSES:  1. Insulin-dependent diabetes mellitus.  2. Hypertension.   OPERATIVE PROCEDURE:  Transurethral resection of the prostate done on  October 15, 2003.   COMPLICATIONS:  None.   DISCHARGE SUMMARY:  This 70 year old male was referred to me by Dr. Sherril Croon.  He has been having moderate prostatic obstructive symptoms for several  months.  He had a slow urinary stream, urinary frequency x3-4, nocturia x2-  3.  He denied having any dysuria, hematuria, urolithiasis in the past.  He  developed inability to void about a month ago.  He went to the emergency  room at [facility].  Catheterization revealed a large amount of  postvoid residual urine.  The patient developed recurrent voiding  difficulty, and he had to be catheterized.  Cystoscopy in the office  revealed benign prostatic hypertrophy with bladder neck obstruction.  A  trial of voiding was done, and the patient was able to void for a few days  with Flomax therapy.  The patient developed recurrent urinary retention, and  he was on Foley catheter drainage.  After counseling, he was brought to the  hospital for transurethral resection of the prostate.   PAST MEDICAL HISTORY:  1. Insulin-dependent diabetes mellitus.  2. Hypertension.  3. Status post bilateral below-knee amputations.   MEDICATIONS:  1. Zelnorm 0.6 mg 1 p.o. b.i.d.  2. Metformin hydrochloride 500 mg p.o. daily.  3. Lipitor 10 mg 1 p.o.  q.h.s.  4. Diltiazem 300 mg 1 p.o. daily.  5. Benicar 40 mg 1 p.o. b.i.d.  6. Maxzide 37.5/25 mg 1 p.o. daily.  7. Klonopin 0.2 mg p.o. 3 times daily.  8. Humulin 70/30 60 units subcutaneous q.a.m.   ALLERGIES:  None.   FAMILY HISTORY:  Positive for diabetes mellitus and hypertension.   EXAMINATION:  VITAL SIGNS:  Height 5 feet 9 inches, weight 196 pounds.  HEENT:  Normal.  NECK:  No masses.  LUNGS:  Clear to auscultation.  HEART:  Regular rate and rhythm.  No murmurs.  ABDOMEN:  Soft.  No palpable flank mass or CVA tenderness.  GENITOURINARY:  Penis uncircumcised.  Foley catheter is draining clear  urine.  Testes normal.  RECTAL:  There is a 40-gram-sized benign prostate.  EXTREMITIES:  Status post below-knee amputations.   ADMISSION LABORATORIES:  Electrolytes within normal range.  BUN 24,  creatinine  1.3.  CBC:  WBC 6.3, hemoglobin 13.3, hematocrit 39.9.   EKG revealed normal sinus rhythm with a rate of 74.   COURSE IN THE HOSPITAL:  The patient was taken to the OR on October 15, 2003.  Under spinal anesthesia, transurethral resection of the prostate was  done.  There were no intraoperative problems.  The patient did well in the  postoperative period.  He was seen by Dr. Josefine Class, the hospitalist, for  management of his medical problems.  On the first postoperative day the  urine became clear, and the continuous bladder irrigation was discontinued.  His postoperative laboratories were within normal range except for glucose  which was elevated at 159.  On the second postoperative day the urine was  clear.  The Foley catheter was removed.  The patient was able to void well  after this.  He was discharged and sent home on October 17, 2003.   DISCHARGE MEDICATIONS:  1. Amoxicillin 500 mg 1 p.o. three times daily for 7 days.  2. Percocet 5/325 1 p.o. q.8h. p.r.n. pain, #20.  3. He was advised to continue his regular medications.   The final pathology of the prostate revealed  benign prostatic hyperplasia.  The patient was asked to call me for fever, chills, voiding difficulty, or  gross hematuria.  He will be reviewed in the office in 2 weeks.     ___________________________________________                                         Dennie Maizes, M.D.   SK/MEDQ  D:  10/25/2003  T:  10/26/2003  Job:  [id number]   cc:   Doreen Beam  [address]  [place]  [place] [address]  Fax: [phone]

## 2011-02-13 NOTE — H&P (Signed)
NAME:  [name], [name]                           ACCOUNT NO.:  [id number]   MEDICAL RECORD NO.:  [id number]                   PATIENT TYPE:  AMB   LOCATION:  DAY                                  FACILITY:  [facility]   PHYSICIAN:  Dennie Maizes, M.D.                DATE OF BIRTH:  1940-12-06   DATE OF ADMISSION:  02/11/2004  DATE OF DISCHARGE:                                HISTORY & PHYSICAL   CHIEF COMPLAINT:  Urinary frequency, nocturia, recurrent inflammation of the  foreskin.   HISTORY OF PRESENT ILLNESS:  This 70 year old male had BPH with bladder neck  obstruction.  He developed acute urinary retention.  Transurethral resection  of the prostate was done in January 2005.  The pathology revealed chronic  prostatitis and benign prostatic hyperplasia.  The patient was voiding well  until recently.  For the past 2 weeks, he has been having increased urinary  frequency and nocturia.  He has to void 20 times during the day, and he gets  up 6 times at night to [place] urine.  He also has mild bowel and urinary  incontinence.  No history of fever, chills or voiding difficulty.  Bladder  scan revealed small post-void residual urine.  The patient was treated with  Ditropan XL 5 mg 1 p.o. daily.  This resulted in voiding difficulty.  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  The patient  also complains of recurrent inflammations of the foreskin with scarring.   PAST MEDICAL HISTORY:  1. History of insulin-dependent diabetes mellitus.  2. Hypertension.  3. Status post bilateral below-knee amputations.  4. Status post TURP in January 2005.   MEDICATIONS:  1. Zelnorm 0.6 mg 1 p.o. b.i.d.  2. Metformin hydrochloride 5 mg 1 p.o. daily.  3. Lipitor 10 mg 1 p.o. q.h.s.  4. Diltiazem 300 mg 1 p.o. daily.  5. Benicar 40 mg 1 p.o. b.i.d.  6. Maxzide 37.5/25 one p.o. daily.  7. Clonidine 0.2 mg p.o. t.i.d.  8. Humulin 70/30, 16 units subcutaneous q.a.m.   ALLERGIES:  None.   FAMILY HISTORY:  Positive for diabetes mellitus and hypertension.   PHYSICAL EXAMINATION:  VITAL SIGNS:  Height 5 feet 9 inches, weight 196  pounds.  HEAD/EYES/EARS/NOSE/THROAT:  Normal.  NECK:  No masses.  LUNGS:  Clear to auscultation.  HEART:  Regular rate and rhythm.  No murmurs.  ABDOMEN:  Soft, no palpable frank mass or CVA tenderness.  Bladder not  palpable.  PENIS:  Uncircumcised.  There is recurrent inflammation of the foreskin with  scarring and phimosis.  RECTAL:  Benign prostate, about 40 g in size.  EXTREMITIES:  Status post bilateral below-knee amputations.   IMPRESSION:  Recurrent balanitis, urinary frequency, nocturia, voiding  difficulty.   PLAN:  Cystoscopy, possible urethral dilation, [facility].  I have discussed with the patient regarding the diagnosis,  operative details, alternative treatments,  outcome, possible risks and  complications, and he has agreed for the procedure to be done.     ___________________________________________                                         Dennie Maizes, M.D.   SK/MEDQ  D:  02/10/2004  T:  02/10/2004  Job:  [id number]   cc:   Doreen Beam  [address]  [place]  [place] [address]  Fax: [phone]

## 2011-02-13 NOTE — Assessment & Plan Note (Signed)
[facility]                          [facility] OFFICE NOTE   [name], [name]                        MRN:          [id number]  DATE:01/25/2007                            DOB:          1941/05/24    PRIMARY CARDIOLOGIST:  Jonelle Sidle, MD   REASON FOR VISIT:  Post-hospital followup.   [name] is a very pleasant 70 year old male, with no prior history of  coronary artery disease, but with numerous cardiac risk factors  including history of peripheral vascular disease, status post BKA,  recently referred to myself and Dr. Diona Browner here at [facility]  for evaluation of chest pain.   Recommendation was to proceed with initial non-invasive workup given the  patient's underlying chronic renal insufficiency. His cardiac markers  were also non-diagnostic with a peak troponin of 0.07.   A 2D echocardiogram revealed normal left ventricular function and an  adenosine stress Cardiolite was felt to be a low-risk study, by Dr.  Diona Browner, with the question of possible anterobasilar ischemia; ejection  fraction of 54%. There was no associated chest discomfort nor diagnostic  EKG changes.   Clinically, the patient has done extremely well since his recent brief  hospitalization with no recurrent angina pectoris. We adjusted his  medication regimen with cessation of Maxzide and initiation of low-dose  Imdur. The patient was also discharged on p.r.n. nitroglycerine. Of  note, the patient has resumed regular activities, including using a  riding mower to mow his lawn.   CURRENT MEDICATIONS:  1. Low-dose aspirin.  2. Benazepril 40 daily.  3. Benicar 40 daily.  4. Metformin 1000 q a.m./500 q p.m.  5. Labetalol 300 t.i.d.  6. Clonidine 0.3 t.i.d.  7. Diltiazem 360 daily.  8. Isosorbide 30 daily.  9. Lipitor 10 daily.  10.Insulin 70/30--30 units q a.m./20 units q p.m.   PHYSICAL EXAMINATION:  Blood pressure 121/53, pulse 59 regular,  weight  204.  GENERAL: A 70 year old male sitting upright in no distress.  HEENT: Normocephalic, atraumatic.  NECK: Palpable bilateral carotid pulses without bruits; no JVD.  LUNGS:  Clear to auscultation bilaterally.  HEART: Regular rate and rhythm (S1, S2). No significant murmurs.  ABDOMEN: Benign.  EXTREMITIES: Bilateral BKA.  NEURO: No focal deficit.   IMPRESSION:  1. Status post chest pain.      a.     Low-risk adenosine stress Cardiolite; ejection fraction of       54%.      b.     Ejection fraction of 65-70% with mild LVH, by recent 2D       echo.  2. Multiple cardiac risk factors.      a.     Insulin-dependent diabetes mellitus.      b.     Hypertension.      c.     Hyperlipidemia.      d.     Age.  3. Chronic renal insufficiency.  4. Peripheral vascular disease.      a.     Status post remote bilateral BKA.  5. Sinus bradycardia.   PLAN:  Continue  medical management for possible underlying coronary  artery disease. The patient remains asymptomatic and will therefore not  make any adjustments of his current medication regimen and have him  return to clinic to followup with myself and Dr. Simona Huh in  approximately four months.      Gene Serpe, PA-C       Learta Codding, MD,FACC    GS/MedQ  DD: 01/25/2007  DT: 01/25/2007  Job #: [id number]   cc:   Doreen Beam

## 2011-02-13 NOTE — Consult Note (Signed)
NAME:  [name], [name]                           ACCOUNT NO.:  [id number]   MEDICAL RECORD NO.:  [id number]                   PATIENT TYPE:  INP   LOCATION:  A218                                 FACILITY:  [facility]   PHYSICIAN:  Vania Rea, M.D.              DATE OF BIRTH:  May 04, 1941   DATE OF CONSULTATION:  10/15/2003  DATE OF DISCHARGE:                                   CONSULTATION   PRIMARY CARE PHYSICIAN:  Dr. Siri Cole.   REASON FOR CONSULTATION:  Status post transurethral resection of prostate.   HISTORY OF PRESENT ILLNESS:  This is a 70 year old African American  gentleman with a history of diabetes, hypertension, status post bilateral  below knee amputation, who is a few hours status post TURP for moderate  prostatic obstruction.  The patient has no complaints, no cough, no back  pain, no dysuria.  Surgery was done under spinal anesthesia with Versed  sedation.   PAST MEDICAL HISTORY:  1. Diabetes diagnosed in 1980.  2. Status post right below knee amputation, 1981; status post left below     knee amputation, 1999, both due to complications of diabetes.  3. Hypertension.  4. Irritable bowel syndrome.   MEDICATIONS:  1. Glucophage 100 mg in the morning, 500 mg in the evening.  2. Humulin 70/30, 60 units each morning.  3. Diltiazem 300 mg daily.  4. Benicar 40 mg daily.  5. Clonidine 0.2 mg three times daily.  6. Lipitor 10 mg at bedtime.  7. Patient is unsure if he takes Maxzide or not.   SOCIAL HISTORY:  He is married with two daughters.  He denies tobacco use.  He discontinued alcohol use after drinking one-fifth of whiskey each weekend  for 30 years.  Discontinued about 6 years ago.  He is a retired Administrator, arts.   FAMILY HISTORY:  His father died at age 35, had history of hypertension and  diabetes.  He has one brother, one sister.  The brother had hypertension and  diabetes, was on dialysis, died of a brain aneurysm.  His sister has  diabetes.  He has  two sons and one daughter.  One son has hypertension and  the daughter has diabetes.   REVIEW OF SYSTEMS:  Noncontributory.  Has no headache or sinus problems.  Apart from the constipation associated with irritable bowel, no GI problems,  no evidence of GI bleeding.  No cough, wheeze and no asthma.  He ambulates  with bilateral prostheses, but only does much walking when playing golf.  Has not played much golf since summer.  Unable to assess his exercise  capacity.   PHYSICAL EXAMINATION:  VITAL SIGNS:  Temperature 96.8, pulse 64,  respirations 18, blood pressure 197/87.  GENERAL:  Weight 192 pounds.  HEENT:  Pink, anicteric.  Mucous membranes are moist.  He has upper and  lower dentures.  CHEST:  Bibasilar crackles, no  expiratory wheezing.  CARDIOVASCULAR:  Regular rhythm, no murmurs.  ABDOMEN:  Obese, soft and nontender.  No organomegaly.  GENITOURINARY:  Foley catheter draining blood-stained urine.  EXTREMITIES:  Bilateral below knee amputations as noted.  No edema, no  tenderness.  CNS:  Alert and oriented x3, no focal deficits.   LABORATORY DATA:  Labs were done three days ago on the 14th of January.  Hemoglobin and hematocrit were normal, 13.3/39.9.  White count 6.3,  platelets 257.  MCV 96.  Chemistry was essentially normal.  BUN and  creatinine 24/1.3.  Chloride 104, CO2-30, sodium 141, potassium 4.1, calcium  9.4, glucose 145.  This morning prior to surgery, his blood sugar was 168  mg/dl.   ASSESSMENT:  1. Diabetes, uncontrolled.  He always says his a.m. fingersticks were     between 97 and 140.  It is to be noted he is taking quite a large dose of     70/30 only once a day.  Will institute q.a.c. and q.h.s. Accu-Cheks to     check his control in the hospital.  Will restart the 70/30 in the     morning.   1. Hypertension is currently uncontrolled.  We will restart his Clonidine     and his Diltiazem short-acting.  For the moment, will hold Adenocard and     review his  blood pressure later.   1. History of irritable bowel syndrome, apparently on Zelnorm.  Will hold     that for the time being.  Will give Colace for constipation.  Will also     add incentive spirometry at bedside.   PLAN:  Will check his a.m. labs, his liver function tests and his hemoglobin  A1C will give [place] some idea of how well controlled it is.   Would like to thank Dennie Maizes, M.D. for allowing [place] to participate in  the care of this patient.      ___________________________________________                                            Vania Rea, M.D.   LC/MEDQ  D:  10/15/2003  T:  10/16/2003  Job:  [id number]

## 2011-02-17 ENCOUNTER — Ambulatory Visit (INDEPENDENT_AMBULATORY_CARE_PROVIDER_SITE_OTHER): Payer: Medicare Other | Admitting: Cardiology

## 2011-02-17 ENCOUNTER — Encounter: Payer: Self-pay | Admitting: Cardiology

## 2011-02-17 VITALS — BP 160/85 | HR 75 | Ht 71.0 in | Wt 173.0 lb

## 2011-02-17 DIAGNOSIS — I509 Heart failure, unspecified: Secondary | ICD-10-CM

## 2011-02-17 DIAGNOSIS — I10 Essential (primary) hypertension: Secondary | ICD-10-CM

## 2011-02-17 DIAGNOSIS — I2510 Atherosclerotic heart disease of native coronary artery without angina pectoris: Secondary | ICD-10-CM

## 2011-02-17 DIAGNOSIS — N189 Chronic kidney disease, unspecified: Secondary | ICD-10-CM

## 2011-02-17 DIAGNOSIS — I5032 Chronic diastolic (congestive) heart failure: Secondary | ICD-10-CM

## 2011-02-17 NOTE — Assessment & Plan Note (Signed)
Clinically stable by report, no progressive shortness of breath.

## 2011-02-17 NOTE — Assessment & Plan Note (Signed)
Continue followup with Dr. Hyman Hopes.

## 2011-02-17 NOTE — Assessment & Plan Note (Signed)
Difficult to control, on multiple medications, and complicated by chronic renal failure.

## 2011-02-17 NOTE — Assessment & Plan Note (Signed)
Multivessel disease, felt to have limited revascularization options based on last angiogram per Dr. Riley Kill. Plan to continue medical therapy. He would be at high risk for contrast nephropathy.

## 2011-02-17 NOTE — Progress Notes (Signed)
Clinical Summary [name] is a 70 y.o.male presenting for followup. He was seen in November 2011.   Patient reports follow up with Dr. Hyman Hopes recently. Lab work from May 11 showed sodium 139, potassium 3.7, BUN 72, creatinine 3.5.  Fortunately, he reports no significant angina. Uses a cane to ambulate with bilateral leg prostheses. States he still is able to get out and play golf saw him. No reported bleeding problems.  He did not bring in his medications today. Phone call was placed to the pharmacy to obtain a recent list.  Blood pressure rechecked.  Allergies no known allergies  Current outpatient prescriptions:amLODipine (NORVASC) 10 MG tablet, Take 10 mg by mouth daily.  , Disp: , Rfl: ;  aspirin (ASPIRIN LOW DOSE) 81 MG chewable tablet, Chew 81 mg by mouth daily.  , Disp: , Rfl: ;  atorvastatin (LIPITOR) 20 MG tablet, Take 20 mg by mouth daily.  , Disp: , Rfl: ;  cloNIDine (CATAPRES) 0.3 MG tablet, Take 0.3 mg by mouth 3 (three) times daily.  , Disp: , Rfl:  docusate sodium (COLACE) 100 MG capsule, Take 100 mg by mouth 2 (two) times daily.  , Disp: , Rfl: ;  ferrous sulfate 325 (65 FE) MG tablet, Take 325 mg by mouth daily with breakfast.  , Disp: , Rfl: ;  hydrALAZINE (APRESOLINE) 50 MG tablet, Take 100 mg by mouth 4 (four) times daily.  , Disp: , Rfl: ;  isosorbide mononitrate (IMDUR) 60 MG 24 hr tablet, Take 60 mg by mouth 2 (two) times daily.  , Disp: , Rfl:  labetalol (NORMODYNE) 300 MG tablet, Take 300 mg by mouth 3 (three) times daily.  , Disp: , Rfl: ;  minoxidil (LONITEN) 2.5 MG tablet, Take 2.5 mg by mouth daily.  , Disp: , Rfl: ;  paricalcitol (ZEMPLAR) 1 MCG capsule, Take 1 mcg by mouth daily.  , Disp: , Rfl: ;  polyethylene glycol (MIRALAX / GLYCOLAX) packet, Take 17 g by mouth daily.  , Disp: , Rfl:  potassium chloride SA (K-DUR,KLOR-CON) 20 MEQ tablet, Take 20 mEq by mouth daily.  , Disp: , Rfl: ;  torsemide (DEMADEX) 20 MG tablet, Take 3 tablets (60 mg total) by mouth 2 (two) times  daily., Disp: 180 tablet, Rfl: 6  Past Medical History  Diagnosis Date  . Coronary atherosclerosis of native coronary artery     Multivessel, occluded RCA, LVEF 60%, managed medically  . Chronic kidney disease (CKD), stage III (moderate)     Dr. Hyman Hopes  . Essential hypertension, benign   . Type 2 diabetes mellitus   . Chronic diastolic heart failure   . Pleural effusion, bilateral     Both transudative and exudative over time  . Peripheral arterial disease   . Mixed hyperlipidemia     Past Surgical History  Procedure Date  . Pleurx catheter drainage with pleurodesis 2010  . Right-sided vats 2010  . Transurethral resection of prostate 2005  . Cystoscopy with urethral dilatation 2005  . Right bka 1999  . Left bka 1981    Family History  Problem Relation Age of Onset  . Hypertension    . Diabetes type II      Social History [name] reports that he has never smoked. He has never used smokeless tobacco. [name] reports that he does not drink alcohol.  Review of Systems No palpitations or syncope. No melena or hematochezia. Otherwise reviewed and negative.  Physical Examination Filed Vitals:   02/17/11 1643  BP: 160/85  Pulse: 75  Chronically ill, overweight male in no acute distress. HEENT: Conjunctiva and lids normal, oropharynx with poor dentition. Neck: Supple, increased girth, no elevated JVP or bruits. Lungs: Diminished but clear breath sounds. Cardiac: Regular rate and rhythm, soft systolic murmur, no S3 gallop. Abdomen: Soft, nontender, bowel sounds present. Extremities: Status post bilateral BKA. Skin: Warm and dry. Musculoskeletal: No kyphosis. Neuropsychiatric: Alert and oriented x3, Affect appropriate.   ECG Sinus rhythm with PR interval 240 ms, IVCD suggestive of LVH with repolarization abnomalities.    Problem List and Plan

## 2011-02-17 NOTE — Patient Instructions (Signed)
   Call office back this afternoon to give list of medications currently taking. Your physician wants you to follow up in: 6 months.  You will receive a reminder letter in the mail one-two months in advance.  If you don't receive a letter, please call our office to schedule the follow up appointment

## 2011-02-18 ENCOUNTER — Ambulatory Visit: Payer: Medicare Other | Admitting: Cardiology

## 2011-02-20 ENCOUNTER — Other Ambulatory Visit: Payer: Self-pay | Admitting: Nephrology

## 2011-02-20 ENCOUNTER — Encounter (HOSPITAL_COMMUNITY): Payer: Medicare Other

## 2011-03-05 ENCOUNTER — Other Ambulatory Visit: Payer: Self-pay | Admitting: *Deleted

## 2011-03-05 MED ORDER — POTASSIUM CHLORIDE CRYS ER 20 MEQ PO TBCR: 20.0000 meq | EXTENDED_RELEASE_TABLET | Freq: Every day | ORAL | Status: AC

## 2011-03-06 ENCOUNTER — Other Ambulatory Visit: Payer: Self-pay | Admitting: Nephrology

## 2011-03-06 ENCOUNTER — Encounter (HOSPITAL_COMMUNITY): Payer: Medicare Other | Attending: Nephrology

## 2011-03-06 DIAGNOSIS — N183 Chronic kidney disease, stage 3 unspecified: Secondary | ICD-10-CM | POA: Insufficient documentation

## 2011-03-06 DIAGNOSIS — D638 Anemia in other chronic diseases classified elsewhere: Secondary | ICD-10-CM | POA: Insufficient documentation

## 2011-03-06 LAB — RENAL FUNCTION PANEL
Albumin: 3.4 g/dL — ABNORMAL LOW (ref 3.5–5.2)
BUN: 77 mg/dL — ABNORMAL HIGH (ref 6–23)
CO2: 31 mEq/L (ref 19–32)
Chloride: 97 mEq/L (ref 96–112)
Glucose, Bld: 173 mg/dL — ABNORMAL HIGH (ref 70–99)
Potassium: 4.1 mEq/L (ref 3.5–5.1)

## 2011-03-09 LAB — PTH, INTACT AND CALCIUM: Calcium, Total (PTH): 8.7 mg/dL (ref 8.4–10.5)

## 2011-03-19 ENCOUNTER — Other Ambulatory Visit: Payer: Self-pay | Admitting: *Deleted

## 2011-03-19 MED ORDER — AMLODIPINE BESYLATE 10 MG PO TABS: 10.0000 mg | ORAL_TABLET | Freq: Every day | ORAL | Status: AC

## 2011-03-20 ENCOUNTER — Encounter (HOSPITAL_COMMUNITY): Payer: Medicare Other

## 2011-03-20 ENCOUNTER — Other Ambulatory Visit: Payer: Self-pay | Admitting: Nephrology

## 2011-03-23 LAB — POCT HEMOGLOBIN-HEMACUE: Hemoglobin: 11.5 g/dL — ABNORMAL LOW (ref 13.0–17.0)

## 2011-03-29 ENCOUNTER — Inpatient Hospital Stay (HOSPITAL_COMMUNITY)
Admission: EM | Admit: 2011-03-29 | Discharge: 2011-04-29 | DRG: 246 | Disposition: E | Payer: Medicare Other | Source: Other Acute Inpatient Hospital | Attending: Internal Medicine | Admitting: Internal Medicine

## 2011-03-29 DIAGNOSIS — E1169 Type 2 diabetes mellitus with other specified complication: Secondary | ICD-10-CM | POA: Diagnosis not present

## 2011-03-29 DIAGNOSIS — I469 Cardiac arrest, cause unspecified: Secondary | ICD-10-CM | POA: Diagnosis not present

## 2011-03-29 DIAGNOSIS — R079 Chest pain, unspecified: Secondary | ICD-10-CM

## 2011-03-29 DIAGNOSIS — I509 Heart failure, unspecified: Secondary | ICD-10-CM | POA: Diagnosis present

## 2011-03-29 DIAGNOSIS — R627 Adult failure to thrive: Secondary | ICD-10-CM | POA: Diagnosis not present

## 2011-03-29 DIAGNOSIS — N170 Acute kidney failure with tubular necrosis: Secondary | ICD-10-CM | POA: Diagnosis not present

## 2011-03-29 DIAGNOSIS — I498 Other specified cardiac arrhythmias: Secondary | ICD-10-CM | POA: Diagnosis not present

## 2011-03-29 DIAGNOSIS — S88119A Complete traumatic amputation at level between knee and ankle, unspecified lower leg, initial encounter: Secondary | ICD-10-CM

## 2011-03-29 DIAGNOSIS — R5381 Other malaise: Secondary | ICD-10-CM | POA: Diagnosis not present

## 2011-03-29 DIAGNOSIS — I214 Non-ST elevation (NSTEMI) myocardial infarction: Principal | ICD-10-CM | POA: Diagnosis present

## 2011-03-29 DIAGNOSIS — Z66 Do not resuscitate: Secondary | ICD-10-CM | POA: Diagnosis not present

## 2011-03-29 DIAGNOSIS — Y849 Medical procedure, unspecified as the cause of abnormal reaction of the patient, or of later complication, without mention of misadventure at the time of the procedure: Secondary | ICD-10-CM | POA: Diagnosis not present

## 2011-03-29 DIAGNOSIS — Z794 Long term (current) use of insulin: Secondary | ICD-10-CM

## 2011-03-29 DIAGNOSIS — N186 End stage renal disease: Secondary | ICD-10-CM | POA: Diagnosis not present

## 2011-03-29 DIAGNOSIS — F05 Delirium due to known physiological condition: Secondary | ICD-10-CM | POA: Diagnosis not present

## 2011-03-29 DIAGNOSIS — D649 Anemia, unspecified: Secondary | ICD-10-CM | POA: Diagnosis present

## 2011-03-29 DIAGNOSIS — I120 Hypertensive chronic kidney disease with stage 5 chronic kidney disease or end stage renal disease: Secondary | ICD-10-CM | POA: Diagnosis present

## 2011-03-29 DIAGNOSIS — Y9210 Unspecified residential institution as the place of occurrence of the external cause: Secondary | ICD-10-CM | POA: Diagnosis not present

## 2011-03-29 DIAGNOSIS — J9600 Acute respiratory failure, unspecified whether with hypoxia or hypercapnia: Secondary | ICD-10-CM | POA: Diagnosis not present

## 2011-03-29 DIAGNOSIS — I70209 Unspecified atherosclerosis of native arteries of extremities, unspecified extremity: Secondary | ICD-10-CM | POA: Diagnosis present

## 2011-03-29 DIAGNOSIS — E46 Unspecified protein-calorie malnutrition: Secondary | ICD-10-CM | POA: Diagnosis present

## 2011-03-29 DIAGNOSIS — E870 Hyperosmolality and hypernatremia: Secondary | ICD-10-CM | POA: Diagnosis not present

## 2011-03-29 DIAGNOSIS — E669 Obesity, unspecified: Secondary | ICD-10-CM | POA: Diagnosis present

## 2011-03-29 DIAGNOSIS — E785 Hyperlipidemia, unspecified: Secondary | ICD-10-CM | POA: Diagnosis present

## 2011-03-29 DIAGNOSIS — G931 Anoxic brain damage, not elsewhere classified: Secondary | ICD-10-CM | POA: Diagnosis not present

## 2011-03-29 DIAGNOSIS — Z7982 Long term (current) use of aspirin: Secondary | ICD-10-CM

## 2011-03-29 DIAGNOSIS — I5043 Acute on chronic combined systolic (congestive) and diastolic (congestive) heart failure: Secondary | ICD-10-CM | POA: Diagnosis not present

## 2011-03-29 DIAGNOSIS — Z8249 Family history of ischemic heart disease and other diseases of the circulatory system: Secondary | ICD-10-CM

## 2011-03-29 DIAGNOSIS — Z515 Encounter for palliative care: Secondary | ICD-10-CM

## 2011-03-29 DIAGNOSIS — IMO0002 Reserved for concepts with insufficient information to code with codable children: Secondary | ICD-10-CM | POA: Diagnosis not present

## 2011-03-29 DIAGNOSIS — I2510 Atherosclerotic heart disease of native coronary artery without angina pectoris: Secondary | ICD-10-CM | POA: Diagnosis present

## 2011-03-29 DIAGNOSIS — I2582 Chronic total occlusion of coronary artery: Secondary | ICD-10-CM | POA: Diagnosis present

## 2011-03-29 DIAGNOSIS — Z833 Family history of diabetes mellitus: Secondary | ICD-10-CM

## 2011-03-29 DIAGNOSIS — R570 Cardiogenic shock: Secondary | ICD-10-CM | POA: Diagnosis not present

## 2011-03-29 LAB — CBC
MCV: 98.5 fL (ref 78.0–100.0)
Platelets: 170 10*3/uL (ref 150–400)
RBC: 4.01 MIL/uL — ABNORMAL LOW (ref 4.22–5.81)
WBC: 10.2 10*3/uL (ref 4.0–10.5)

## 2011-03-29 LAB — HEPATIC FUNCTION PANEL
ALT: 37 U/L (ref 0–53)
Alkaline Phosphatase: 104 U/L (ref 39–117)
Bilirubin, Direct: 0.1 mg/dL (ref 0.0–0.3)
Indirect Bilirubin: 0.2 mg/dL — ABNORMAL LOW (ref 0.3–0.9)
Total Bilirubin: 0.3 mg/dL (ref 0.3–1.2)

## 2011-03-29 LAB — PRO B NATRIURETIC PEPTIDE: Pro B Natriuretic peptide (BNP): 8309 pg/mL — ABNORMAL HIGH (ref 0–125)

## 2011-03-29 LAB — BASIC METABOLIC PANEL
BUN: 85 mg/dL — ABNORMAL HIGH (ref 6–23)
Calcium: 8.6 mg/dL (ref 8.4–10.5)
Creatinine, Ser: 3.94 mg/dL — ABNORMAL HIGH (ref 0.50–1.35)
GFR calc Af Amer: 18 mL/min — ABNORMAL LOW (ref >60)

## 2011-03-29 LAB — GLUCOSE, CAPILLARY
Glucose-Capillary: 283 mg/dL — ABNORMAL HIGH (ref 70–99)
Glucose-Capillary: 217 mg/dL — ABNORMAL HIGH (ref 70–99)
Glucose-Capillary: 117 mg/dL — ABNORMAL HIGH (ref 70–99)

## 2011-03-29 LAB — CARDIAC PANEL(CRET KIN+CKTOT+MB+TROPI): Total CK: 1320 U/L — ABNORMAL HIGH (ref 7–232)

## 2011-03-29 LAB — MRSA PCR SCREENING: MRSA by PCR: NEGATIVE

## 2011-03-29 LAB — HEPARIN LEVEL (UNFRACTIONATED): Heparin Unfractionated: 1.16 IU/mL — ABNORMAL HIGH (ref 0.30–0.70)

## 2011-03-30 ENCOUNTER — Inpatient Hospital Stay (HOSPITAL_COMMUNITY): Payer: Medicare Other

## 2011-03-30 DIAGNOSIS — I319 Disease of pericardium, unspecified: Secondary | ICD-10-CM

## 2011-03-30 DIAGNOSIS — I2109 ST elevation (STEMI) myocardial infarction involving other coronary artery of anterior wall: Secondary | ICD-10-CM

## 2011-03-30 DIAGNOSIS — J9600 Acute respiratory failure, unspecified whether with hypoxia or hypercapnia: Secondary | ICD-10-CM

## 2011-03-30 DIAGNOSIS — N184 Chronic kidney disease, stage 4 (severe): Secondary | ICD-10-CM

## 2011-03-30 DIAGNOSIS — I200 Unstable angina: Secondary | ICD-10-CM

## 2011-03-30 LAB — DIFFERENTIAL
Basophils Absolute: 0 10*3/uL (ref 0.0–0.1)
Eosinophils Relative: 0 % (ref 0–5)
Lymphocytes Relative: 3 % — ABNORMAL LOW (ref 12–46)
Monocytes Absolute: 0.9 10*3/uL (ref 0.1–1.0)
Monocytes Relative: 9 % (ref 3–12)

## 2011-03-30 LAB — URINALYSIS, ROUTINE W REFLEX MICROSCOPIC
Glucose, UA: NEGATIVE mg/dL
Ketones, ur: NEGATIVE mg/dL
Protein, ur: 100 mg/dL — AB

## 2011-03-30 LAB — POCT I-STAT 3, ART BLOOD GAS (G3+)
Patient temperature: 97.4
TCO2: 29 mmol/L (ref 0–100)
pCO2 arterial: 54.1 mmHg — ABNORMAL HIGH (ref 35.0–45.0)
pH, Arterial: 7.303 — ABNORMAL LOW (ref 7.350–7.450)

## 2011-03-30 LAB — BLOOD GAS, ARTERIAL
Acid-base deficit: 1.6 mmol/L (ref 0.0–2.0)
Bicarbonate: 24.4 mEq/L — ABNORMAL HIGH (ref 20.0–24.0)
Drawn by: 311371
FIO2: 0.6 %
O2 Saturation: 92 %
PEEP: 5 cmH2O
Patient temperature: 98.6
RATE: 16 resp/min

## 2011-03-30 LAB — GLUCOSE, CAPILLARY
Glucose-Capillary: 126 mg/dL — ABNORMAL HIGH (ref 70–99)
Glucose-Capillary: 124 mg/dL — ABNORMAL HIGH (ref 70–99)
Glucose-Capillary: 115 mg/dL — ABNORMAL HIGH (ref 70–99)

## 2011-03-30 LAB — CBC
HCT: 38.6 % — ABNORMAL LOW (ref 39.0–52.0)
Hemoglobin: 13 g/dL (ref 13.0–17.0)
MCH: 32.9 pg (ref 26.0–34.0)
MCHC: 33.7 g/dL (ref 30.0–36.0)
MCV: 97.7 fL (ref 78.0–100.0)
RDW: 13.5 % (ref 11.5–15.5)

## 2011-03-30 LAB — POCT ACTIVATED CLOTTING TIME
Activated Clotting Time: 149 seconds
Activated Clotting Time: 182 seconds
Activated Clotting Time: 199 seconds

## 2011-03-30 LAB — BASIC METABOLIC PANEL
BUN: 94 mg/dL — ABNORMAL HIGH (ref 6–23)
CO2: 24 mEq/L (ref 19–32)
Chloride: 99 mEq/L (ref 96–112)
Glucose, Bld: 96 mg/dL (ref 70–99)
Potassium: 4.1 mEq/L (ref 3.5–5.1)
Sodium: 139 mEq/L (ref 135–145)

## 2011-03-30 LAB — URINE MICROSCOPIC-ADD ON

## 2011-03-30 LAB — CARDIAC PANEL(CRET KIN+CKTOT+MB+TROPI): CK, MB: 83.3 ng/mL (ref 0.3–4.0)

## 2011-03-31 ENCOUNTER — Inpatient Hospital Stay (HOSPITAL_COMMUNITY): Payer: Medicare Other

## 2011-03-31 LAB — BLOOD GAS, ARTERIAL
MECHVT: 550 mL
PEEP: 5 cmH2O
RATE: 22 resp/min
pCO2 arterial: 38.2 mmHg (ref 35.0–45.0)
pH, Arterial: 7.34 — ABNORMAL LOW (ref 7.350–7.450)

## 2011-03-31 LAB — RENAL FUNCTION PANEL
Albumin: 2.6 g/dL — ABNORMAL LOW (ref 3.5–5.2)
Chloride: 96 mEq/L (ref 96–112)
Creatinine, Ser: 6.21 mg/dL — ABNORMAL HIGH (ref 0.50–1.35)
GFR calc non Af Amer: 9 mL/min — ABNORMAL LOW (ref >60)
Phosphorus: 7.3 mg/dL — ABNORMAL HIGH (ref 2.3–4.6)
Potassium: 4.1 mEq/L (ref 3.5–5.1)

## 2011-03-31 LAB — CBC
MCHC: 33.3 g/dL (ref 30.0–36.0)
Platelets: 131 10*3/uL — ABNORMAL LOW (ref 150–400)
RDW: 14.3 % (ref 11.5–15.5)
WBC: 13.2 10*3/uL — ABNORMAL HIGH (ref 4.0–10.5)

## 2011-03-31 LAB — GLUCOSE, CAPILLARY
Glucose-Capillary: 125 mg/dL — ABNORMAL HIGH (ref 70–99)
Glucose-Capillary: 144 mg/dL — ABNORMAL HIGH (ref 70–99)
Glucose-Capillary: 119 mg/dL — ABNORMAL HIGH (ref 70–99)

## 2011-03-31 LAB — URINE CULTURE
Colony Count: NO GROWTH
Culture  Setup Time: 201207021813

## 2011-03-31 LAB — PROCALCITONIN: Procalcitonin: 65.98 ng/mL

## 2011-03-31 LAB — ALT: ALT: 37 U/L (ref 0–53)

## 2011-03-31 NOTE — Consult Note (Signed)
NAME:  [name], [name] NO.:  [id number]  MEDICAL RECORD NO.:  [id number]  LOCATION:  2909                         FACILITY:  [facility]  PHYSICIAN:  Zetta Bills, MD          DATE OF BIRTH:  [birth year]  DATE OF CONSULTATION:  03/30/2011 DATE OF DISCHARGE:                                CONSULTATION   Nephrology is consulted by Dr. Tonny Bollman of Cardiology for the evaluation and management of [name] acute renal failure on chronic kidney disease stage IV.  HISTORY OF PRESENTING ILLNESS:  [name] is a 70 year old African American man with past medical history significant for two-vessel coronary artery disease; peripheral artery disease, status post bilateral below-knee amputations; diabetes mellitus for the last 30 years and chronic kidney disease stage IV (baseline creatinine between 3.6-3.9).  He presented to the hospital yesterday with progressive worsening shortness of breath and was diagnosed to have acute non-ST elevation myocardial infarction.  Undergoing emergent left heart catheterization, which revealed occluded LAD, status post stenting, thrombus in the ostial ramus and intervention was limited.  Since this procedure yesterday, urine output is noted to have been tapering on downwards and creatinine is noted to have been rising.  Complicating his non-ST elevation myocardial infarction, he unfortunately had acute decompensation of systolic heart failure and underwent intubation for respiratory failure.  PAST MEDICAL HISTORY: 1. Chronic kidney disease, stage IV, access placement was apparently     being discussed. 2. Diabetes mellitus for the last 30 years. 3. Coronary artery disease. 4. Peripheral vascular disease, status post bilateral BKA. 5. Dyslipidemia. 6. Hypertension. 7. Chronic pleural effusions.  MEDICATIONS:  Apparently supposed to taking; 1. Torsemide 60 mg b.i.d. 2. Potassium chloride 20 mEq daily. 3. Labetalol 300 mg t.i.d. 4.  Hydralazine 100 mg q.6 h. 5. Aspirin 81 mg p.o. daily. 6. Imdur 60 mg daily. 7. Diltiazem 360 mg daily. 8. Clonidine 0.3 mg t.i.d. 9. Stool softener 100 mg b.i.d. 10.Lipitor 20 mg nightly. 11.Insulin 70/30 15 units b.i.d. 12.Procrit 20,000 international units every other week. 13.Amitiza 8 mcg daily.  ALLERGIES:  No known drug allergies.  SOCIAL HISTORY:  Married, resides at home with his wife.  Denies any tobacco or alcohol use.  Three children who are healthy.  FAMILY HISTORY:  Negative for CKD/ESRD.  REVIEW OF SYSTEMS:  Unable to obtain as the patient is intubated.  PHYSICAL EXAMINATION:  GENERAL EVALUATION:  Medium-built, elderly African American man, intubated, sedated. HEAD, NECK and ENT SYSTEMS:  Head is normocephalic and atraumatic. Pupils are reactive to light.  On examination of the neck, supple.  No obvious JVD or goiter. PULMONARY:  Examination of his respiratory system, both lung fields are clear to auscultation.  No rales, retractions, or rhonchi. ABDOMEN:  Examination of his abdomen, soft, obese, nontender without any organomegaly and bowel sounds are normal. EXTREMITIES:  Bilaterally intact BKA stumps noted, right one with dressing over his knee area. VITAL SIGNS:  Blood pressure 110/63 ranging to 133/71, pulse of 85, respiratory rate ranging 22-30.  LABORATORY DATA:  Sodium 139, potassium 4.1, bicarbonate 24, BUN 94, creatinine 4.4, glucose 96, calcium 8.6, albumin 3.1.  Hemoglobin 13, hematocrit  38.6, white cell count 9900, platelet count 145,000, pH 7.27, pCO2 55, pO2 70.  ASSESSMENT AND PLAN: 1. Acute renal failure on chronic kidney disease stage IV.  Suspected     ischemic versus toxic (contrast mediated) nephropathy, acute     tubular necrosis with likely progression to end-stage renal     disease.  He is currently oligoanuric and without any spontaneous     clearance; however, his electrolytes at this time appear fair and     he does not appear to  have any acute hemodialysis needs at this     time.  His volume status is fair at best and he appears to be     tolerating his ventilator settings at this time fairly well.  The     plan is to give that furosemide a chance in order to assess tubular     activity and if he fails diuretic therapy, plan to initiate     hemodialysis tomorrow through catheter. 2. Anterior wall myocardial infarction, status post percutaneous     transluminal coronary angioplasty.  Complicating congestive heart     failure and pulmonary edema/ventilator-dependent respiratory     failure noted.  Overall, poor prognosis appreciated. 3. Ventilator-dependent respiratory failure.  Per Critical Care     Service, continue ventilator support.     Zetta Bills, MD     JP/MEDQ  D:  03/30/2011  T:  03/31/2011  Job:  [id number]  Electronically Signed by Zetta Bills MD on 03/31/2011 03:12:30 PM

## 2011-04-01 ENCOUNTER — Inpatient Hospital Stay (HOSPITAL_COMMUNITY): Payer: Medicare Other

## 2011-04-01 DIAGNOSIS — I2109 ST elevation (STEMI) myocardial infarction involving other coronary artery of anterior wall: Secondary | ICD-10-CM

## 2011-04-01 LAB — GLUCOSE, CAPILLARY
Glucose-Capillary: 117 mg/dL — ABNORMAL HIGH (ref 70–99)
Glucose-Capillary: 136 mg/dL — ABNORMAL HIGH (ref 70–99)
Glucose-Capillary: 172 mg/dL — ABNORMAL HIGH (ref 70–99)
Glucose-Capillary: 178 mg/dL — ABNORMAL HIGH (ref 70–99)
Glucose-Capillary: 187 mg/dL — ABNORMAL HIGH (ref 70–99)
Glucose-Capillary: 208 mg/dL — ABNORMAL HIGH (ref 70–99)
Glucose-Capillary: 209 mg/dL — ABNORMAL HIGH (ref 70–99)
Glucose-Capillary: 228 mg/dL — ABNORMAL HIGH (ref 70–99)
Glucose-Capillary: 232 mg/dL — ABNORMAL HIGH (ref 70–99)
Glucose-Capillary: 281 mg/dL — ABNORMAL HIGH (ref 70–99)

## 2011-04-01 LAB — CBC
HCT: 30 % — ABNORMAL LOW (ref 39.0–52.0)
Hemoglobin: 10.3 g/dL — ABNORMAL LOW (ref 13.0–17.0)
MCHC: 34.3 g/dL (ref 30.0–36.0)
RBC: 3.13 MIL/uL — ABNORMAL LOW (ref 4.22–5.81)

## 2011-04-01 LAB — COMPREHENSIVE METABOLIC PANEL
ALT: 34 U/L (ref 0–53)
Alkaline Phosphatase: 75 U/L (ref 39–117)
BUN: 108 mg/dL — ABNORMAL HIGH (ref 6–23)
CO2: 22 mEq/L (ref 19–32)
GFR calc Af Amer: 10 mL/min — ABNORMAL LOW (ref >60)
GFR calc non Af Amer: 8 mL/min — ABNORMAL LOW (ref >60)
Glucose, Bld: 207 mg/dL — ABNORMAL HIGH (ref 70–99)
Potassium: 4.2 mEq/L (ref 3.5–5.1)
Sodium: 136 mEq/L (ref 135–145)
Total Bilirubin: 0.3 mg/dL (ref 0.3–1.2)
Total Protein: 6.6 g/dL (ref 6.0–8.3)

## 2011-04-01 LAB — LIPID PANEL
Cholesterol: 86 mg/dL (ref 0–200)
Total CHOL/HDL Ratio: 9.6 RATIO

## 2011-04-01 LAB — APTT: aPTT: 32 seconds (ref 24–37)

## 2011-04-02 ENCOUNTER — Inpatient Hospital Stay (HOSPITAL_COMMUNITY): Payer: Medicare Other

## 2011-04-02 LAB — BLOOD GAS, ARTERIAL
Bicarbonate: 23.2 mEq/L (ref 20.0–24.0)
O2 Saturation: 95.2 %
PEEP: 5 cmH2O
TCO2: 24.3 mmol/L (ref 0–100)
pH, Arterial: 7.401 (ref 7.350–7.450)
pO2, Arterial: 80.1 mmHg (ref 80.0–100.0)

## 2011-04-02 LAB — CULTURE, RESPIRATORY W GRAM STAIN

## 2011-04-02 LAB — CBC
HCT: 29.4 % — ABNORMAL LOW (ref 39.0–52.0)
Hemoglobin: 10 g/dL — ABNORMAL LOW (ref 13.0–17.0)
MCHC: 34 g/dL (ref 30.0–36.0)
MCV: 95.1 fL (ref 78.0–100.0)
RDW: 14.4 % (ref 11.5–15.5)

## 2011-04-02 LAB — GLUCOSE, CAPILLARY
Glucose-Capillary: 132 mg/dL — ABNORMAL HIGH (ref 70–99)
Glucose-Capillary: 135 mg/dL — ABNORMAL HIGH (ref 70–99)
Glucose-Capillary: 157 mg/dL — ABNORMAL HIGH (ref 70–99)
Glucose-Capillary: 165 mg/dL — ABNORMAL HIGH (ref 70–99)
Glucose-Capillary: 184 mg/dL — ABNORMAL HIGH (ref 70–99)
Glucose-Capillary: 190 mg/dL — ABNORMAL HIGH (ref 70–99)
Glucose-Capillary: 190 mg/dL — ABNORMAL HIGH (ref 70–99)
Glucose-Capillary: 258 mg/dL — ABNORMAL HIGH (ref 70–99)

## 2011-04-02 LAB — BASIC METABOLIC PANEL
BUN: 100 mg/dL — ABNORMAL HIGH (ref 6–23)
CO2: 25 mEq/L (ref 19–32)
Chloride: 98 mEq/L (ref 96–112)
Creatinine, Ser: 6.2 mg/dL — ABNORMAL HIGH (ref 0.50–1.35)
Potassium: 3.6 mEq/L (ref 3.5–5.1)

## 2011-04-02 LAB — MAGNESIUM: Magnesium: 3 mg/dL — ABNORMAL HIGH (ref 1.5–2.5)

## 2011-04-02 LAB — VANCOMYCIN, RANDOM: Vancomycin Rm: 25.6 ug/mL

## 2011-04-02 LAB — HEPATITIS B CORE ANTIBODY, TOTAL: Hep B Core Total Ab: NEGATIVE

## 2011-04-02 LAB — CLOSTRIDIUM DIFFICILE BY PCR: Toxigenic C. Difficile by PCR: NEGATIVE

## 2011-04-03 ENCOUNTER — Inpatient Hospital Stay (HOSPITAL_COMMUNITY): Payer: Medicare Other

## 2011-04-03 ENCOUNTER — Encounter (HOSPITAL_COMMUNITY): Payer: Medicare Other

## 2011-04-03 LAB — CBC
HCT: 31.2 % — ABNORMAL LOW (ref 39.0–52.0)
HCT: 35.6 % — ABNORMAL LOW (ref 39.0–52.0)
Hemoglobin: 10.7 g/dL — ABNORMAL LOW (ref 13.0–17.0)
Hemoglobin: 12 g/dL — ABNORMAL LOW (ref 13.0–17.0)
MCH: 32.8 pg (ref 26.0–34.0)
MCV: 95.7 fL (ref 78.0–100.0)
Platelets: 139 10*3/uL — ABNORMAL LOW (ref 150–400)
RBC: 3.26 MIL/uL — ABNORMAL LOW (ref 4.22–5.81)
RDW: 14.8 % (ref 11.5–15.5)
WBC: 23.5 10*3/uL — ABNORMAL HIGH (ref 4.0–10.5)

## 2011-04-03 LAB — BLOOD GAS, ARTERIAL
FIO2: 0.4 %
Patient temperature: 99.7
RATE: 16 resp/min
pCO2 arterial: 42.3 mmHg (ref 35.0–45.0)

## 2011-04-03 LAB — APTT: aPTT: 33 seconds (ref 24–37)

## 2011-04-03 LAB — GLUCOSE, CAPILLARY
Glucose-Capillary: 121 mg/dL — ABNORMAL HIGH (ref 70–99)
Glucose-Capillary: 133 mg/dL — ABNORMAL HIGH (ref 70–99)
Glucose-Capillary: 157 mg/dL — ABNORMAL HIGH (ref 70–99)
Glucose-Capillary: 161 mg/dL — ABNORMAL HIGH (ref 70–99)
Glucose-Capillary: 178 mg/dL — ABNORMAL HIGH (ref 70–99)
Glucose-Capillary: 216 mg/dL — ABNORMAL HIGH (ref 70–99)
Glucose-Capillary: 259 mg/dL — ABNORMAL HIGH (ref 70–99)
Glucose-Capillary: 265 mg/dL — ABNORMAL HIGH (ref 70–99)
Glucose-Capillary: 268 mg/dL — ABNORMAL HIGH (ref 70–99)
Glucose-Capillary: 271 mg/dL — ABNORMAL HIGH (ref 70–99)

## 2011-04-03 LAB — CARDIAC PANEL(CRET KIN+CKTOT+MB+TROPI)
CK, MB: 4.4 ng/mL — ABNORMAL HIGH (ref 0.3–4.0)
Total CK: 179 U/L (ref 7–232)

## 2011-04-03 LAB — COMPREHENSIVE METABOLIC PANEL
BUN: 77 mg/dL — ABNORMAL HIGH (ref 6–23)
CO2: 26 mEq/L (ref 19–32)
Calcium: 8.5 mg/dL (ref 8.4–10.5)
Creatinine, Ser: 5.04 mg/dL — ABNORMAL HIGH (ref 0.50–1.35)
GFR calc Af Amer: 14 mL/min — ABNORMAL LOW (ref >60)
GFR calc non Af Amer: 11 mL/min — ABNORMAL LOW (ref >60)
Glucose, Bld: 314 mg/dL — ABNORMAL HIGH (ref 70–99)
Total Bilirubin: 0.3 mg/dL (ref 0.3–1.2)

## 2011-04-03 LAB — POCT I-STAT 3, VENOUS BLOOD GAS (G3P V)
pCO2, Ven: 58.3 mmHg — ABNORMAL HIGH (ref 45.0–50.0)
pH, Ven: 7.186 — CL (ref 7.250–7.300)

## 2011-04-03 LAB — PROTIME-INR
INR: 1.47 (ref 0.00–1.49)
Prothrombin Time: 18.1 seconds — ABNORMAL HIGH (ref 11.6–15.2)

## 2011-04-04 ENCOUNTER — Inpatient Hospital Stay (HOSPITAL_COMMUNITY): Payer: Medicare Other

## 2011-04-04 LAB — RENAL FUNCTION PANEL
Albumin: 2.2 g/dL — ABNORMAL LOW (ref 3.5–5.2)
Calcium: 8.3 mg/dL — ABNORMAL LOW (ref 8.4–10.5)
GFR calc Af Amer: 21 mL/min — ABNORMAL LOW (ref >60)
Glucose, Bld: 210 mg/dL — ABNORMAL HIGH (ref 70–99)
Phosphorus: 4 mg/dL (ref 2.3–4.6)
Sodium: 141 mEq/L (ref 135–145)

## 2011-04-04 LAB — GLUCOSE, CAPILLARY
Glucose-Capillary: 191 mg/dL — ABNORMAL HIGH (ref 70–99)
Glucose-Capillary: 204 mg/dL — ABNORMAL HIGH (ref 70–99)
Glucose-Capillary: 102 mg/dL — ABNORMAL HIGH (ref 70–99)
Glucose-Capillary: 130 mg/dL — ABNORMAL HIGH (ref 70–99)

## 2011-04-04 LAB — CBC
Hemoglobin: 11.7 g/dL — ABNORMAL LOW (ref 13.0–17.0)
MCH: 33.3 pg (ref 26.0–34.0)
MCHC: 34.8 g/dL (ref 30.0–36.0)

## 2011-04-04 LAB — PROCALCITONIN: Procalcitonin: 45.24 ng/mL

## 2011-04-05 ENCOUNTER — Inpatient Hospital Stay (HOSPITAL_COMMUNITY): Payer: Medicare Other

## 2011-04-05 LAB — BASIC METABOLIC PANEL
GFR calc Af Amer: 12 mL/min — ABNORMAL LOW (ref >60)
GFR calc non Af Amer: 10 mL/min — ABNORMAL LOW (ref >60)
Potassium: 3.4 mEq/L — ABNORMAL LOW (ref 3.5–5.1)
Sodium: 145 mEq/L (ref 135–145)

## 2011-04-05 LAB — CBC
Platelets: 197 10*3/uL (ref 150–400)
RBC: 3.47 MIL/uL — ABNORMAL LOW (ref 4.22–5.81)
WBC: 14.2 10*3/uL — ABNORMAL HIGH (ref 4.0–10.5)

## 2011-04-05 LAB — GLUCOSE, CAPILLARY
Glucose-Capillary: 129 mg/dL — ABNORMAL HIGH (ref 70–99)
Glucose-Capillary: 130 mg/dL — ABNORMAL HIGH (ref 70–99)
Glucose-Capillary: 132 mg/dL — ABNORMAL HIGH (ref 70–99)
Glucose-Capillary: 206 mg/dL — ABNORMAL HIGH (ref 70–99)

## 2011-04-06 ENCOUNTER — Inpatient Hospital Stay (HOSPITAL_COMMUNITY): Payer: Medicare Other

## 2011-04-06 LAB — RENAL FUNCTION PANEL
Albumin: 1.8 g/dL — ABNORMAL LOW (ref 3.5–5.2)
Calcium: 8.3 mg/dL — ABNORMAL LOW (ref 8.4–10.5)
Creatinine, Ser: 7.28 mg/dL — ABNORMAL HIGH (ref 0.50–1.35)
GFR calc non Af Amer: 7 mL/min — ABNORMAL LOW (ref >60)

## 2011-04-06 LAB — GLUCOSE, CAPILLARY
Glucose-Capillary: 119 mg/dL — ABNORMAL HIGH (ref 70–99)
Glucose-Capillary: 114 mg/dL — ABNORMAL HIGH (ref 70–99)
Glucose-Capillary: 88 mg/dL (ref 70–99)

## 2011-04-06 LAB — POCT I-STAT 3, ART BLOOD GAS (G3+)
Acid-base deficit: 6 mmol/L — ABNORMAL HIGH (ref 0.0–2.0)
O2 Saturation: 100 %

## 2011-04-06 LAB — BASIC METABOLIC PANEL
Calcium: 8.5 mg/dL (ref 8.4–10.5)
Chloride: 111 mEq/L (ref 96–112)
Creatinine, Ser: 6.75 mg/dL — ABNORMAL HIGH (ref 0.50–1.35)
GFR calc Af Amer: 10 mL/min — ABNORMAL LOW (ref >60)

## 2011-04-06 LAB — CULTURE, BLOOD (ROUTINE X 2)
Culture  Setup Time: 201207030222
Culture  Setup Time: 201207030222
Culture: NO GROWTH

## 2011-04-06 LAB — CBC
Hemoglobin: 11.2 g/dL — ABNORMAL LOW (ref 13.0–17.0)
Hemoglobin: 11 g/dL — ABNORMAL LOW (ref 13.0–17.0)
MCH: 32.2 pg (ref 26.0–34.0)
MCH: 32.5 pg (ref 26.0–34.0)
MCHC: 33.3 g/dL (ref 30.0–36.0)
MCHC: 32 g/dL (ref 30.0–36.0)
MCHC: 31.5 g/dL (ref 30.0–36.0)
MCV: 100.6 fL — ABNORMAL HIGH (ref 78.0–100.0)
MCV: 103.3 fL — ABNORMAL HIGH (ref 78.0–100.0)
Platelets: 268 10*3/uL (ref 150–400)
Platelets: 234 10*3/uL (ref 150–400)
RDW: 15.5 % (ref 11.5–15.5)
WBC: 17.2 10*3/uL — ABNORMAL HIGH (ref 4.0–10.5)

## 2011-04-06 LAB — COMPREHENSIVE METABOLIC PANEL
ALT: 261 U/L — ABNORMAL HIGH (ref 0–53)
Calcium: 8.5 mg/dL (ref 8.4–10.5)
Creatinine, Ser: 4.58 mg/dL — ABNORMAL HIGH (ref 0.50–1.35)
GFR calc Af Amer: 15 mL/min — ABNORMAL LOW (ref >60)
Glucose, Bld: 227 mg/dL — ABNORMAL HIGH (ref 70–99)
Sodium: 147 mEq/L — ABNORMAL HIGH (ref 135–145)
Total Protein: 6.7 g/dL (ref 6.0–8.3)

## 2011-04-06 LAB — PROCALCITONIN: Procalcitonin: 18.85 ng/mL

## 2011-04-07 ENCOUNTER — Inpatient Hospital Stay (HOSPITAL_COMMUNITY): Payer: Medicare Other

## 2011-04-07 LAB — CBC
MCH: 32.4 pg (ref 26.0–34.0)
MCHC: 31.4 g/dL (ref 30.0–36.0)
MCV: 103.4 fL — ABNORMAL HIGH (ref 78.0–100.0)
Platelets: 276 10*3/uL (ref 150–400)
RDW: 15.6 % — ABNORMAL HIGH (ref 11.5–15.5)

## 2011-04-07 LAB — RENAL FUNCTION PANEL
Albumin: 1.9 g/dL — ABNORMAL LOW (ref 3.5–5.2)
BUN: 120 mg/dL — ABNORMAL HIGH (ref 6–23)
Calcium: 8.2 mg/dL — ABNORMAL LOW (ref 8.4–10.5)
Chloride: 108 mEq/L (ref 96–112)
Creatinine, Ser: 6.4 mg/dL — ABNORMAL HIGH (ref 0.50–1.35)
GFR calc non Af Amer: 9 mL/min — ABNORMAL LOW (ref >60)
Phosphorus: 9.1 mg/dL — ABNORMAL HIGH (ref 2.3–4.6)

## 2011-04-07 LAB — GLUCOSE, CAPILLARY
Glucose-Capillary: 117 mg/dL — ABNORMAL HIGH (ref 70–99)
Glucose-Capillary: 127 mg/dL — ABNORMAL HIGH (ref 70–99)
Glucose-Capillary: 97 mg/dL (ref 70–99)
Glucose-Capillary: 65 mg/dL — ABNORMAL LOW (ref 70–99)

## 2011-04-14 ENCOUNTER — Telehealth: Payer: Self-pay | Admitting: Internal Medicine

## 2011-04-14 NOTE — Telephone Encounter (Addendum)
Death Certificate Received Via Courier,sent to me by [employer] [place]) [name] would not sign D/C, she sent to see if [name] would Sign sent to [employer] to c if I can get Signature.  04/14/11/km  [name] Signed Original death Cert..Mailed to [facility] Dept.in Envelope provided  04/14/11/km

## 2011-04-16 NOTE — H&P (Signed)
NAME:  [name], [name] NO.:  [id number]  MEDICAL RECORD NO.:  [id number]  LOCATION:  2909                         FACILITY:  [facility]  PHYSICIAN:  Doylene Canning. Ladona Ridgel, MD    DATE OF BIRTH:  Jun 22, 1941  DATE OF ADMISSION:  04/19/2011 DATE OF DISCHARGE:                             HISTORY & PHYSICAL   ADMISSION DIAGNOSIS:  Acute non-ST-elevation myocardial infarction.  CHIEF COMPLAINTS:  Chest pain.  HISTORY OF PRESENT ILLNESS:  The patient is a very pleasant 70 year old man with known severe two-vessel coronary artery disease who had catheterization in 2009 and was not thought to be an operative candidate.  This was secondary to severe diffuse diabetic distal disease.  At that time, his LAD was heavily calcified, but free of significant obstruction.  The patient was in his usual health until yesterday when he developed substernal chest pain associated with shortness of breath.  This is his typical anginal type symptoms. Associated with this was diaphoresis.  He presented to the emergency room where his initial EKG demonstrated upsloping ST elevation and T- wave inversions in the inferior and lateral and anterolateral leads. Subsequent cardiac markers are positive with troponin of 5.  He is transferred down here for additional evaluation and treatment.  After initial 5 mg of IV Lopressor, the patient's symptoms have much improved.  His past medical history is notable for longstanding renal insufficiency with creatinine of 3.5-4.  He has a history of longstanding diabetes for over 30 years.  He has severe peripheral vascular disease and is status post bilateral below-the-knee amputations.  He has a history of chronic pleural effusions and is status post Pleur-Evac placed over a year ago. He has a history of hypertension which has been longstanding.  SOCIAL HISTORY:  The patient denies tobacco or ethanol abuse.  FAMILY HISTORY:  Negative for premature coronary artery  disease.  It is strongly positive for hypertension and diabetes.  REVIEW OF SYSTEMS:  The patient is able to ambulate with the use of prosthesis.  He has dyspnea with exertion, class II previously.  He has had occasional cough.  Otherwise, all systems are reviewed and negative.  PHYSICAL EXAMINATION:  GENERAL:  He is a pleasant 70 year old man.  He is a bit anxious appearing, but in no acute respiratory distress. VITAL SIGNS:  His initial blood pressure was 175/90.  The pulse was 85 and regular.  The respirations were 18.  Temperature was 98. HEENT:  Normocephalic and atraumatic.  Pupils equal and round.  The oropharynx moist.  Sclerae anicteric. NECK:  An 8-cm jugular venous distention.  There is no thyromegaly and trachea is midline.  The carotids are 2+ and symmetric. LUNGS:  Rales in the bases bilaterally with scattered wheezes. CARDIAC:  Regular rate and rhythm and normal S1 and S2.  There was a fairly prominent S4 gallop present.  The PMI was not particularly enlarged or laterally displaced. ABDOMEN:  Soft and nontender.  There is no organomegaly. EXTREMITIES:  Bilateral below-the-knee amputations.  Femoral pulses were intact. NEUROLOGIC:  Alert and oriented x3 with cranial nerves intact.  Strength was 5/5 and symmetric.  The EKG demonstrates sinus rhythm with upsloping ST  elevation of 1 mm and terminal T-wave inversions in the inferolateral leads.  LABORATORY DATA:  As previously noted.  The troponin was almost 6.  IMPRESSION:  Non-ST-elevation myocardial infarction.  DISCUSSION:  The patient is not a particularly good interventional candidate and is not a surgical candidate at this point.  I have recommended that the patient undergo a period of watchful waiting, continue his IV heparin and Integrilin, aspirin and add IV Lopressor. We will plan on following the patient very carefully.  He will be a very poor percutaneous coronary intervention candidate.  We will obtain a  2-D echo to evaluate his LV function as part of his risk stratification.  He will also continue on statin therapy and we will see how he does from here.     Doylene Canning. Ladona Ridgel, MD     GWT/MEDQ  D:  04/12/2011  T:  03/30/2011  Job:  [id number]  cc:   Doreen Beam, MD  Electronically Signed by Lewayne Bunting MD on 04/16/2011 08:37:37 AM

## 2011-04-19 NOTE — Consult Note (Signed)
NAME:  [name], [name] NO.:  [id number]  MEDICAL RECORD NO.:  [id number]  LOCATION:  2909                         FACILITY:  [facility]  PHYSICIAN:  Nyja Westbrook L. Ladona Ridgel, MD  DATE OF BIRTH:  [birth year]  DATE OF CONSULTATION:  04/06/2011 DATE OF DISCHARGE:  04/17/2011                                CONSULTATION   REFERRING PHYSICIAN:  Dr. Vassie Loll.  PRIMARY CARDIOLOGIST:  Jonelle Sidle, MD.  PRIMARY CARE PHYSICIAN:  Doreen Beam, MD  NEPHROLOGIST:  Garnetta Buddy, MD  REASON FOR CONSULTATION:  Medical treatment options.  This DNP, Dorian Pod, received report from the team, reviewed medical records, examined the patient, then met with the patient's spouse, Damyan Corne; son, Ethelene Browns; and son, Ludwin to discuss goals, options, and end-of-life issues.  Ivor Messier can be reached at [phone]. Ethelene Browns can be reached at [phone], and Brandun can be reached at [phone].  A detailed discussion was had regarding advanced directives with concept specific to the difference between an aggressive medical interventions path versus a palliative comfort care path for this patient at this time in his current situation.  Time in 2 p.m., time out 3:30 p.m. with a 90-minute consultation.  Greater than 50% of this time spent in medical counseling, coordination of care, and discussion of the pathophysiology of the patient's disease process.  PROBLEM LIST: 1. Failure to thrive. 2. Respiratory compromise.  He is currently ventilator dependent. 3. Chest pain with known history of coronary artery disease status     post MI this admission, status post unsuccessful cardiac     catheterization for reperfusion. 4. Electrolyte imbalance. 5. Glomerular filtration rate of 10 and creatinine of 6.75. 6. Leukocytosis with elevated procalcitonin level. 7. Altered mental status, status post cardiac arrest. 8. Palliative performance score of 20-30% at best. 9. Immobility deconditioning at baseline  secondary to bilateral above     knee amputation. 10.Protein malnutrition with albumin of 2.1.  Currently, the patient is intubated, however, he does open his eyes.  He makes eye contact.  He moves his left arm and will squeeze with his left hand.  He is able to nod and confirm yes or no to basic questions.  Per nursing, he has been weaned x4 hours today.  He is scheduled for hemodialysis later today around 6 p.m., then hopeful for extubation following hemodialysis.  Goals of care at this time have been established by family.  They would like to continue aggressive care for now.  Family wants to proceed with hemodialysis today and hopeful for the patient being weaned off ventilator later this evening.  We have discussed in detail the worst case scenarios for this patient including cardiac and respiratory arrest during hemodialysis or once he is extubated.  Family is comfortable that the patient would not want to have a tracheostomy placed, but would like to initiate CPR if condition warrants.  Even if prognosis is poor and the possibility that the patient would not return to baseline, they are also receptive to temporary tube feedings if indicated.  I will continue to follow along with this patient on a step-by-step basis and recall as needed.  Thank you for  the opportunity to assist with this patient's care.  I can be reached at [phone].     Dorian Pod, ACNP   ______________________________ Katharina Caper. Ladona Ridgel, MD    MB/MEDQ  D:  2011/04/15  T:  04/05/2011  Job:  [id number]  cc:   Dr. Vassie Loll. Jonelle Sidle, MD Doreen Beam, MD Garnetta Buddy, M.D.  Electronically Signed by Dorian Pod ACNP on 04/16/2011 03:54:55 PM Electronically Signed by Derenda Mis MD on 04/19/2011 12:57:22 PM

## 2011-04-29 NOTE — Cardiovascular Report (Signed)
NAME:  [name], [name] NO.:  [id number]  MEDICAL RECORD NO.:  [id number]  LOCATION:  2909                         FACILITY:  [facility]  PHYSICIAN:  Corky Crafts, MDDATE OF BIRTH:  14-Apr-1941  DATE OF PROCEDURE:  04/24/2011 DATE OF DISCHARGE:                           CARDIAC CATHETERIZATION   PRIMARY CARDIOLOGIST:  Jonelle Sidle, MD  PROCEDURES PERFORMED:  Coronary angiogram, PCI of the LAD, PCI of the ramus.  OPERATOR:  Corky Crafts, MD  INDICATIONS:  Anterior ST-elevation MI with evidence of cardiogenic shock, the patient had pulmonary edema with increasing oxygen requirements along with worsening ECG changes and persistent chest pain.  PROCEDURE NARRATIVE:  The patient had evidence of non-STEMI earlier in the day due to his advanced renal disease, medical management was preferred; however, his pain persisted for hours and an ECG done at about 5:45 showed worsening anterior ST elevation with biphasic T-waves while prior ECGs had been nondiagnostic.  Given the new ECG changes, I had a long discussion with the patient, his wife, and his son.  The patient in the past had refused dialysis.  He had been recommended by Dr. Hyman Hopes that an AV fistula be placed, but he had declined.  Given the high likelihood that a cardiac catheterization would precipitate renal failure requiring dialysis, we had a lengthy discussion regarding his wishes given the fact that medical therapy had not worked and he continued to have pain and now had increasing oxygen requirement and evidence of pulmonary edema, he was willing to undergo dialysis if the medicines were unsuccessful.  We then proceeded with emergent cardiac catheterization, however, his renal issues did cause a delay.  He was brought emergently to the cath lab, his oxygen saturations decreased into the 80s on 100% non-rebreather.  Anesthesia was called to intubate the patient.  Just as they got there, his  oxygen saturation started to come back up.  He was placed on a wedge and while his head was up somewhat, his breathing improved greatly.  We performed catheterization without intubation.  His right groin was infiltrated with 1% lidocaine. A 6-French sheath was placed into right common femoral artery using modified Seldinger technique.  Left coronary artery angiography was performed using a CLS 3.5 guiding catheter.  We did not perform right coronary artery angiography because the RCA was known to be occluded from a previous cath.  Visual angiography was performed in multiple projections using hand injection of contrast.  Angiomax was used for anticoagulation.  The intervention was performed.  Please see below for details.  FINDINGS:  The left main was widely patent.  Left anterior descending was patent in the proximal portion.  The mid vessel was occluded.  There is a medium-sized diagonal before the occlusion.  The left circumflex was occluded at the ostium of the distal circumflex system filled with faint left-to-left collaterals.The ramus had an ostial hazy area, which appeared to be thrombus.  In the mid vessel, there is 90% lesion.  There was TIMI 3 flow in this vessel.  PCI NARRATIVE:  CLS 3.5 guiding catheter was used.  Prowater wire was placed in the LAD.  BMW wire was placed to  the ramus.  A 2.5 x 15 Emerge balloon was advanced to the mid LAD and inflated several times to 10 atmospheres to restore what appeared to be TIMI 1 flow in the LAD.  A 2.5 x 20 PROMUS  Element stent was deployed.  While the proximal and stented area was patent, the distal vessel had slow flow.  We gave intracoronary verapamil and nitroglycerin to treat this spasm and no reflow.  There did appear to be about an 80% stenosis in the vessel distal to the stent.  A 2.5 x 12 balloon was advanced to the stented area and used to post dilate and inflated to 14 atmospheres.  It was subsequently advanced to  about 80% stenosis and inflated, but there was recoil of the vessel.  A 2.0 x 12 Emerge was advanced to the distal LAD in an attempt to clear any disease.  There was no success.  The 80% lesions stayed.  A 2.25 x 16 PROMUS stent was placed overlapping the distal edge of 2.5 stent.  This was deployed at 14 atmospheres and then reinflated in the overlap to 16 atmospheres.  PCI of the ramus; a 2.5 x 15 Emerge balloon was advanced to the ostial ramus, which had hazy 90% lesion, it was inflated to 6 atmospheres for 15 seconds.  There was no visible change in this area.  This 2.5 x 15 would not cross the 90% lesion in the mid vessel.  A 2.0 x 12 Emerge then did cross the vessel.  It was inflated to 14 atmospheres; however, this lesion was so heavily calcified that there was significant recoil and no significant change after PTCA.  We also tried an express way in the ostial ramus to try and remove thrombus, but there was no significant change in the image.  TIMI 3 flow was maintained in the vessel.  It is certainly possible that the ostial ramus was a calcified lesion rather than a thrombotic lesion.  IMPRESSION: 1. 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  Stents were placed in the mid-to-distal     LAD, but he had no reflow, which resulted TIMI 1 flow in the distal     vessel.  The distal vessel was very small and diffusely diseased.     This had been noted back in 2009 as well. 2. Apparent thrombus in the ostial ramus.  In the mid ramus, there is     a 90% heavily calcified lesion.  The patient also had lateral ST     elevation and given his overall status where his oxygen requirement     is going up and he was showing evidence of shock, we did attempt     revascularization of this vessel.  He had unsuccessful PTCA to the     mid ramus aspiration thrombectomy and balloon angioplasty did not     change the ostial ramus.  We did not  stent the ostial ramus for     fear of plaque shift into the proximal LAD.  TIMI 3 flow was     maintained in this vessel. 3. Balloon pump was not placed because by the end of the procedure, he     was pain free and his blood pressure was stable.  He also has     significant known peripheral vascular disease.  RECOMMENDATIONS:  Continue medical therapy.  We will continue his Integrilin for 18 hours and receive Plavix.  I have contacted renal already to follow and for need for possible dialysis.  Due to his occluded RCA, occluded circumflex, and now poor flow in the apical LAD I think his overall prognosis is poor.  He has residual disease in the ramus, which could not be successfully treated at this time.  I have explained to the family that due to his diffuse disease and the current nature of the small coronary vessels, he is likely to have congestive heart failure.  Continue supportive therapy.     Corky Crafts, MD     JSV/MEDQ  D:  08-Apr-2011  T:  03/30/2011  Job:  [id number]  cc:   Doylene Canning. Ladona Ridgel, MD Jonelle Sidle, MD  Electronically Signed by Lance Muss MD on 04/29/2011 01:16:08 PM

## 2011-04-29 DEATH — deceased

## 2011-06-09 NOTE — Discharge Summary (Signed)
NAME:  [name], [name] NO.:  [id number]  MEDICAL RECORD NO.:  [id number]  LOCATION:  2909                         FACILITY:  [facility]  PHYSICIAN:  Cassell Clement, M.D. DATE OF BIRTH:  Nov 21, 1940  DATE OF ADMISSION:  2011-04-27 DATE OF DISCHARGE:  05-06-2011                              DISCHARGE SUMMARY   DEATH SUMMARY  DATE OF DEATH:  05/06/2011  PROCEDURES: 1. Cardiac catheterization. 2. Coronary arteriogram. 3. Percutaneous intervention to the left anterior descending. 4. Percutaneous intervention to the ramus intermedius. 5. Serial chest x-rays. 6. 2-D echocardiogram. 7. Intubation and mechanical ventilation.  PRIMARY FINAL DISCHARGE DIAGNOSIS:  Pulseless electrical activity arrest.  SECONDARY DIAGNOSES: 1. Acute non-ST elevation myocardial infarction. 2. Acute on chronic diastolic heart failure. 3. History of anemia. 4. Chronic kidney disease, stage III. 5. Diabetes. 6. Hypertension. 7. Peripheral vascular disease. 8. Status post bilateral below-knee amputations. 9. Hyperlipidemia. 10.History of right pleural effusion status post insertion of a PleurX     catheter with talc pleurodesis and video-assisted thoracoscopic     surgery. 11.Failure to thrive. 12.Altered mental status post cardiac arrest. 13.Deconditioning secondary to immobility. 14.Protein malnutrition. 15.Acute on chronic renal failure this admission.  HOSPITAL COURSE:  [name] is a 70 year old male with a known coronary artery disease.  He had chest pain and his EKG was abnormal with elevated troponins.  His blood pressure was also elevated.  He was admitted for further evaluation and treatment.  [name] developed cardiogenic shock and pulmonary edema with persistent chest pain.  He was taken to the cath lab and the LAD was occluded in the mid vessel with the circumflex occluded at the ostium of the distal circumflex.  The ramus had an ostial hazy area, which appeared  to be thrombus and a 90% lesion in the mid vessel.  He had stents placed to the LAD and PTCA was unsuccessful.  Aspiration thrombectomy was attempted, but was also unsuccessful.  There was still TIMI 3 flow in that vessel.  A balloon pump was not needed.  Early on March 30, 2011, his respiratory status deteriorated.  He was put on a non-rebreather, but his sats were unable to be maintained.  He was intubated for airway protection and management.  He was sedated and Pulmonary was called to help manage the vent.  A 2-D echocardiogram showed an EF of 50% to 55% with diastolic dysfunction and wall motion abnormalities.  A Renal consult was called as well because his creatinine was 4.4, which is slightly higher than his preadmission creatinine.  Dr. Allena Katz saw him and noted that he was currently oligoanuric and without spontaneous clearance.  He was not on pressors and hemodialysis was recommended for volume management.  He developed a fever.  Bronchioalveolar lavage was performed to help with diagnosis and management of his respiratory issues.  He was placed on a Lasix drip, but this was ineffective at volume management and a dialysis catheter was inserted.  He had dialysis on March 31, 2011, and thereafter per Renal team.  He developed a coagulopathy, which was treated with vitamin K.  The Nephrology team followed him closely as did Pulmonary for management.  He continued to get dialysis as needed.  His overall prognosis was poor and his family was updated.  His blood sugars were managed with Glucommander and sliding scale insulin.  On April 03, 2011, he had a PEA arrest with 5 minutes of CPR and 2 rounds of epinephrine. His pulses returned and his blood pressure stabilized.  He was in sinus tachycardia.  It was still felt that once his volume status improved, he could be weaned from the vent.  However, although a head CT was negative, he remained unresponsive.  A Medicine consult was called.   On April 05, 2011, he was waking up some.  It was felt that his kidney function would not recover hence dialysis would be a long-term procedure.  On April 06, 2011, a Palliative Care consult was called. Their initial recommendations were to continue aggressive care for now. The family wanted to wean the patient in order to hopefully communicate with him to get his wishes; however, on April 06, 2011, p.m., he had a PEA arrest during dialysis.  Chest compressions were started immediately and he received 4 mg of epinephrine as well as 2 of atropine and some amiodarone.  He also received an amp of bicarb.  He had return of spontaneous circulation after 15 minutes of CPR, but then had a second arrest minutes later.  He received another dose of epi and CPR for 4 minutes with return of blood pressure and pulse.  The situation was discussed with the family, and it was decided to make him a do not resuscitate/do not intubate.  Cardiology was at the bedside.  On April 07, 2011, he remained intubated and unresponsive.  The Renal team was involved as well, but no further dialysis was planned.  The family gathered at the bedside.  Life support was withdrawn and the patient expired at 12:45 p.m. on April 07, 2011 with the chaplain present.     [name] Demark, PA-C   ______________________________ Cassell Clement, M.D.    RB/MEDQ  D:  05/20/2011  T:  05/20/2011  Job:  [id number]  Electronically Signed by [name] Demark PA-C on 05/27/2011 06:26:06 AM Electronically Signed by Cassell Clement M.D. on 06/09/2011 09:33:19 AM

## 2011-06-26 LAB — GLUCOSE, CAPILLARY
Glucose-Capillary: 117 — ABNORMAL HIGH
Glucose-Capillary: 133 — ABNORMAL HIGH
Glucose-Capillary: 139 — ABNORMAL HIGH
Glucose-Capillary: 166 — ABNORMAL HIGH
Glucose-Capillary: 199 — ABNORMAL HIGH
Glucose-Capillary: 206 — ABNORMAL HIGH
Glucose-Capillary: 259 — ABNORMAL HIGH
Glucose-Capillary: 89

## 2011-06-26 LAB — BASIC METABOLIC PANEL
BUN: 28 — ABNORMAL HIGH
BUN: 32 — ABNORMAL HIGH
BUN: 32 — ABNORMAL HIGH
CO2: 24
CO2: 25
Calcium: 8.3 — ABNORMAL LOW
Calcium: 8.4
Calcium: 8.5
Chloride: 105
Chloride: 105
Chloride: 108
Creatinine, Ser: 2.19 — ABNORMAL HIGH
Creatinine, Ser: 2.23 — ABNORMAL HIGH
GFR calc Af Amer: 28 — ABNORMAL LOW
GFR calc Af Amer: 33 — ABNORMAL LOW
GFR calc Af Amer: 36 — ABNORMAL LOW
GFR calc Af Amer: 37 — ABNORMAL LOW
GFR calc non Af Amer: 23 — ABNORMAL LOW
GFR calc non Af Amer: 27 — ABNORMAL LOW
GFR calc non Af Amer: 27 — ABNORMAL LOW
GFR calc non Af Amer: 28 — ABNORMAL LOW
Glucose, Bld: 90
Glucose, Bld: 97
Potassium: 4.3
Potassium: 4.4
Potassium: 4.6
Sodium: 138
Sodium: 139
Sodium: 140
Sodium: 142

## 2011-06-26 LAB — RETICULOCYTES
RBC.: 2.83 — ABNORMAL LOW
RBC.: 2.85 — ABNORMAL LOW
Retic Count, Absolute: 31.4
Retic Count, Absolute: 36.8
Retic Ct Pct: 1.3

## 2011-06-26 LAB — BODY FLUID CELL COUNT WITH DIFFERENTIAL: Monocyte-Macrophage-Serous Fluid: 54

## 2011-06-26 LAB — POCT I-STAT 3, VENOUS BLOOD GAS (G3P V)
Acid-base deficit: 1
Bicarbonate: 24.6 — ABNORMAL HIGH
Bicarbonate: 25 — ABNORMAL HIGH
O2 Saturation: 66
TCO2: 26
TCO2: 26
pCO2, Ven: 45.9
pO2, Ven: 33
pO2, Ven: 37

## 2011-06-26 LAB — PROTEIN ELECTROPH W RFLX QUANT IMMUNOGLOBULINS
Albumin ELP: 43.4 — ABNORMAL LOW
Alpha-2-Globulin: 18.3 — ABNORMAL HIGH
Beta 2: 6.7 — ABNORMAL HIGH
Beta Globulin: 7
Total Protein ELP: 5.6 — ABNORMAL LOW

## 2011-06-26 LAB — COMPREHENSIVE METABOLIC PANEL
AST: 19
Albumin: 2.1 — ABNORMAL LOW
Albumin: 2.3 — ABNORMAL LOW
BUN: 31 — ABNORMAL HIGH
CO2: 22
Calcium: 8.4
Chloride: 105
Chloride: 108
Creatinine, Ser: 2.16 — ABNORMAL HIGH
Creatinine, Ser: 2.2 — ABNORMAL HIGH
GFR calc Af Amer: 37 — ABNORMAL LOW
GFR calc non Af Amer: 30 — ABNORMAL LOW
Total Bilirubin: 0.5
Total Protein: 5.5 — ABNORMAL LOW

## 2011-06-26 LAB — URINALYSIS, ROUTINE W REFLEX MICROSCOPIC
Bilirubin Urine: NEGATIVE
Glucose, UA: NEGATIVE
Hgb urine dipstick: NEGATIVE
Protein, ur: >300 — AB
Urobilinogen, UA: 0.2

## 2011-06-26 LAB — POCT I-STAT 3, ART BLOOD GAS (G3+)
Acid-base deficit: 1
O2 Saturation: 94
TCO2: 25

## 2011-06-26 LAB — PROTEIN, BODY FLUID: Total protein, fluid: <3

## 2011-06-26 LAB — CBC
HCT: 26.5 — ABNORMAL LOW
HCT: 27.9 — ABNORMAL LOW
HCT: 29.5 — ABNORMAL LOW
Hemoglobin: 8.3 — ABNORMAL LOW
Hemoglobin: 9.1 — ABNORMAL LOW
Hemoglobin: 9.7 — ABNORMAL LOW
Hemoglobin: 9.7 — ABNORMAL LOW
MCHC: 31.8
MCHC: 32.2
MCHC: 32.3
MCHC: 32.6
MCHC: 32.9
MCV: 93.9
MCV: 94.5
MCV: 94.9
MCV: 95.1
MCV: 95.3
Platelets: 261
Platelets: 279
Platelets: 285
Platelets: 293
Platelets: 298
RBC: 2.69 — ABNORMAL LOW
RBC: 2.76 — ABNORMAL LOW
RBC: 2.95 — ABNORMAL LOW
RBC: 2.99 — ABNORMAL LOW
RBC: 3.15 — ABNORMAL LOW
RDW: 15.8 — ABNORMAL HIGH
RDW: 16.3 — ABNORMAL HIGH
RDW: 16.3 — ABNORMAL HIGH
WBC: 6.2
WBC: 6.4
WBC: 6.8
WBC: 6.9
WBC: 7.4
WBC: 7.7
WBC: 9.3

## 2011-06-26 LAB — DIFFERENTIAL
Lymphocytes Relative: 16
Lymphs Abs: 1.2
Monocytes Absolute: 0.6
Monocytes Relative: 7
Neutro Abs: 5.5
Neutrophils Relative %: 74

## 2011-06-26 LAB — AMYLASE, BODY FLUID: Amylase, Fluid: 22

## 2011-06-26 LAB — IMMUNOFIXATION, URINE

## 2011-06-26 LAB — FUNGUS CULTURE W SMEAR: Fungal Smear: NONE SEEN

## 2011-06-26 LAB — CROSSMATCH
ABO/RH(D): A POS
Antibody Screen: NEGATIVE

## 2011-06-26 LAB — GLUCOSE, SEROUS FLUID

## 2011-06-26 LAB — PROTIME-INR: INR: 1.1

## 2011-06-26 LAB — IMMUNOFIXATION ELECTROPHORESIS
IgA: 432 — ABNORMAL HIGH
IgM, Serum: 68

## 2011-06-26 LAB — CARDIAC PANEL(CRET KIN+CKTOT+MB+TROPI)
CK, MB: 3
Relative Index: 2.8 — ABNORMAL HIGH
Troponin I: 0.28 — ABNORMAL HIGH
Troponin I: 0.28 — ABNORMAL HIGH

## 2011-06-26 LAB — C3 COMPLEMENT: C3 Complement: 120

## 2011-06-26 LAB — URINE MICROSCOPIC-ADD ON

## 2011-06-26 LAB — PROTEIN, URINE, 24 HOUR

## 2011-06-26 LAB — SAVE SMEAR

## 2011-06-26 LAB — FOLATE: Folate: 8.7

## 2011-06-26 LAB — AFB CULTURE WITH SMEAR (NOT AT ARMC): Acid Fast Smear: NONE SEEN

## 2011-06-26 LAB — PROTEIN, TOTAL: Total Protein: 5.3 — ABNORMAL LOW

## 2011-06-26 LAB — IRON AND TIBC: UIBC: 249

## 2011-06-26 LAB — LIPID PANEL
Total CHOL/HDL Ratio: 3.4
VLDL: 17

## 2011-06-26 LAB — APTT: aPTT: 21 — ABNORMAL LOW

## 2011-06-26 LAB — FERRITIN: Ferritin: 81 (ref 22–322)

## 2011-06-26 LAB — LACTATE DEHYDROGENASE, PLEURAL OR PERITONEAL FLUID: LD, Fluid: 63 — ABNORMAL HIGH

## 2011-06-26 LAB — PH, BODY FLUID: pH, Fluid: 7.76

## 2011-06-26 LAB — HEMOGLOBIN A1C: Mean Plasma Glucose: 126

## 2011-06-30 LAB — IRON AND TIBC
Iron: 34 ug/dL — ABNORMAL LOW (ref 42–135)
Saturation Ratios: 13 % — ABNORMAL LOW (ref 20–55)
TIBC: 266 ug/dL (ref 215–435)

## 2011-06-30 LAB — FERRITIN: Ferritin: 106 ng/mL (ref 22–322)

## 2011-06-30 LAB — RENAL FUNCTION PANEL
CO2: 29 mEq/L (ref 19–32)
Chloride: 103 mEq/L (ref 96–112)
Creatinine, Ser: 2.48 mg/dL — ABNORMAL HIGH (ref 0.4–1.5)
GFR calc Af Amer: 32 mL/min — ABNORMAL LOW (ref >60)
GFR calc non Af Amer: 26 mL/min — ABNORMAL LOW (ref >60)
Glucose, Bld: 225 mg/dL — ABNORMAL HIGH (ref 70–99)
Sodium: 140 mEq/L (ref 135–145)

## 2011-06-30 LAB — POCT HEMOGLOBIN-HEMACUE: Hemoglobin: 10 g/dL — ABNORMAL LOW (ref 13.0–17.0)

## 2011-07-03 LAB — RENAL FUNCTION PANEL
Albumin: 2.9 g/dL — ABNORMAL LOW (ref 3.5–5.2)
CO2: 35 mEq/L — ABNORMAL HIGH (ref 19–32)
Chloride: 97 mEq/L (ref 96–112)
GFR calc Af Amer: 24 mL/min — ABNORMAL LOW (ref >60)
GFR calc non Af Amer: 20 mL/min — ABNORMAL LOW (ref >60)
Glucose, Bld: 166 mg/dL — ABNORMAL HIGH (ref 70–99)
Potassium: 3.2 mEq/L — ABNORMAL LOW (ref 3.5–5.1)
Potassium: 3.7 mEq/L (ref 3.5–5.1)
Sodium: 134 mEq/L — ABNORMAL LOW (ref 135–145)
Sodium: 139 mEq/L (ref 135–145)

## 2011-07-03 LAB — POCT HEMOGLOBIN-HEMACUE
Hemoglobin: 11.2 g/dL — ABNORMAL LOW (ref 13.0–17.0)
Hemoglobin: 11.3 g/dL — ABNORMAL LOW (ref 13.0–17.0)
Hemoglobin: 12.6 g/dL — ABNORMAL LOW (ref 13.0–17.0)

## 2011-07-03 LAB — IRON AND TIBC
Iron: 43 ug/dL (ref 42–135)
TIBC: 260 ug/dL (ref 215–435)

## 2012-09-08 IMAGING — CR DG CHEST 1V PORT
1 series · 1 of 1 positions shown · non-contrast
Comparison: 10/02/2009

CLINICAL DATA: Intubation.  Respiratory distress.

PORTABLE CHEST - 1 VIEW

[AP]
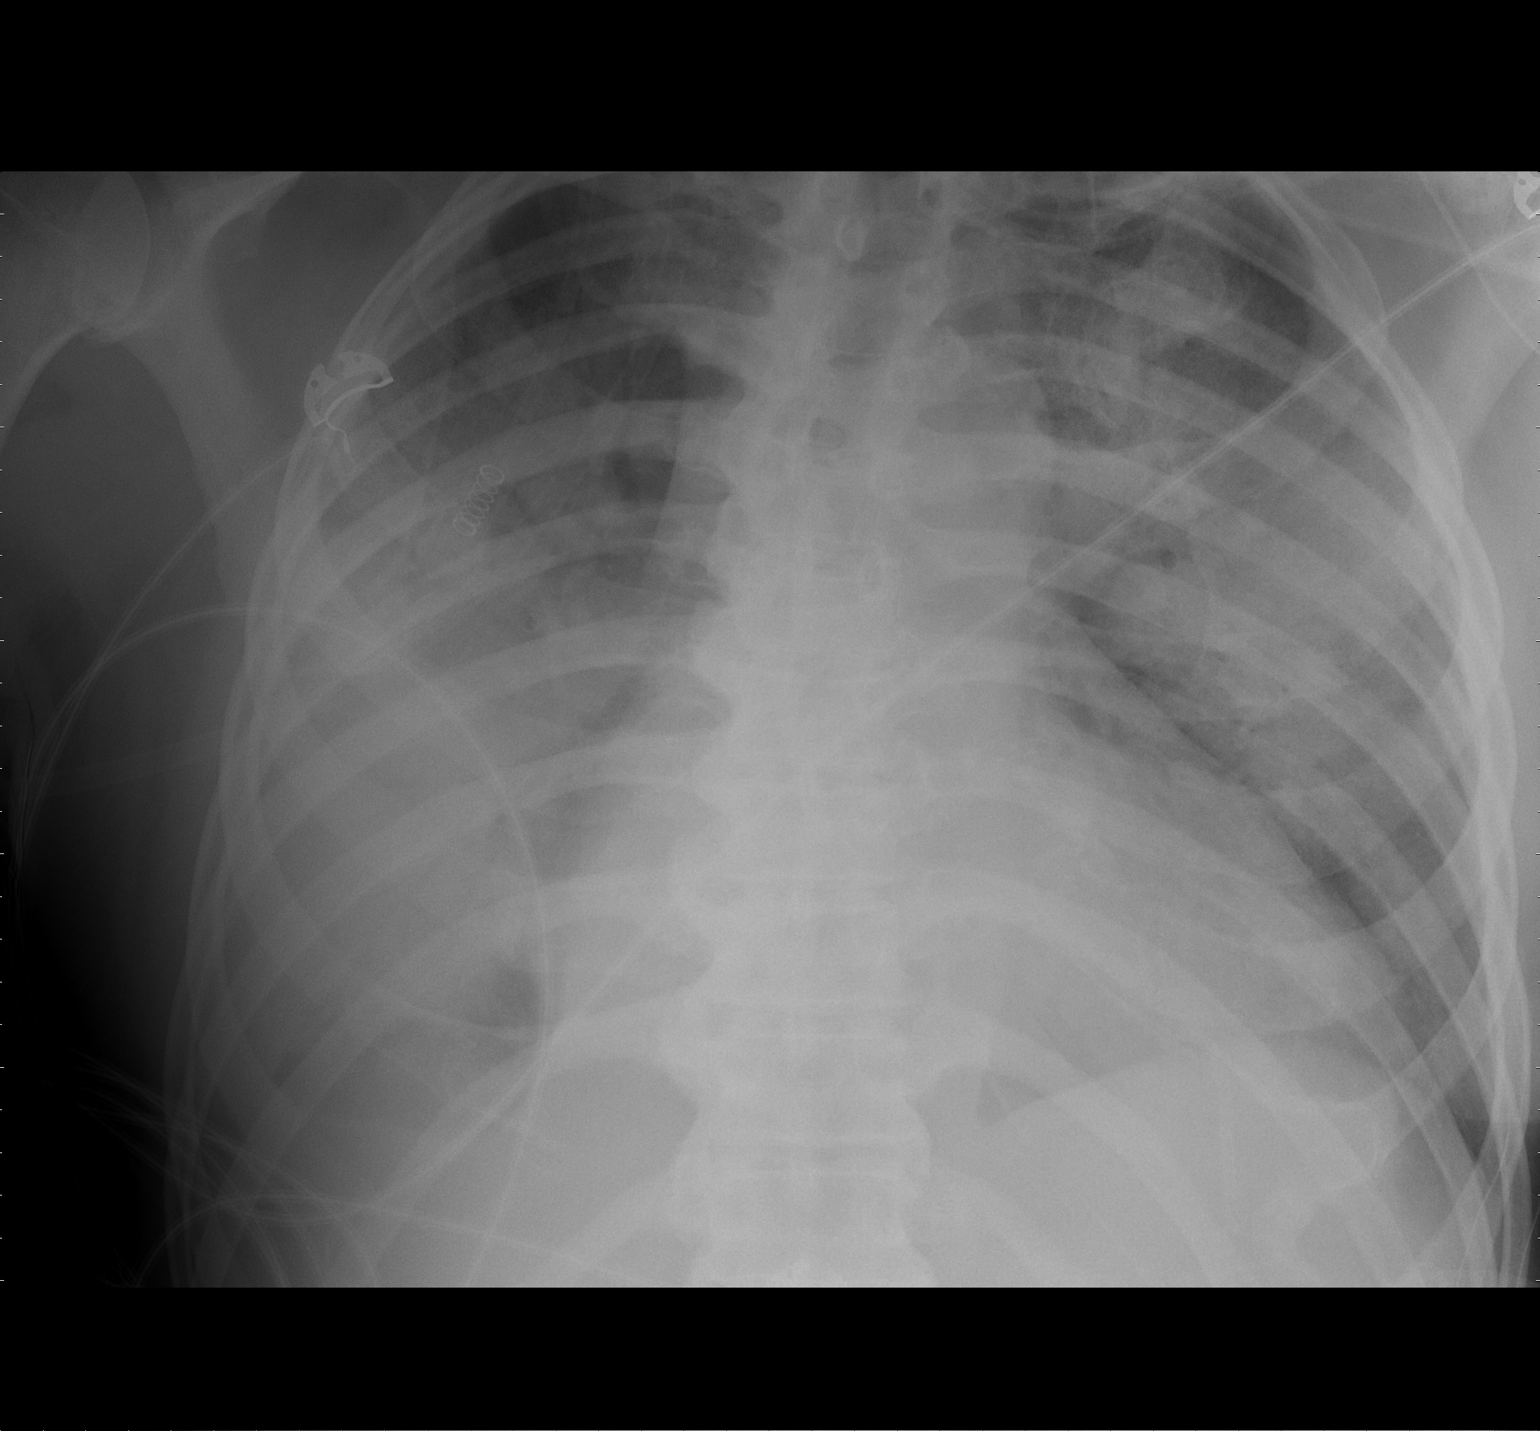

[1 of 1 positions shown; findings below may reference images not displayed]

FINDINGS: Endotracheal tube is 7 cm above the carina.  There is a
consolidation noted in the right lower and mid lung as well as the
left upper lobe concerning for pneumonia.  There is cardiomegaly.
Possible right effusion.
IMPRESSION: Endotracheal tube 7 cm above the carina.

Patchy bilateral airspace disease concerning for pneumonia.

Cardiomegaly.

## 2012-09-09 IMAGING — CR DG CHEST 1V PORT
1 series · 1 of 1 positions shown · non-contrast
Comparison: 03/31/2011.

CLINICAL DATA: Hemodialysis catheter placement.  Ventilator
therapy.

PORTABLE CHEST - 1 VIEW

[AP]
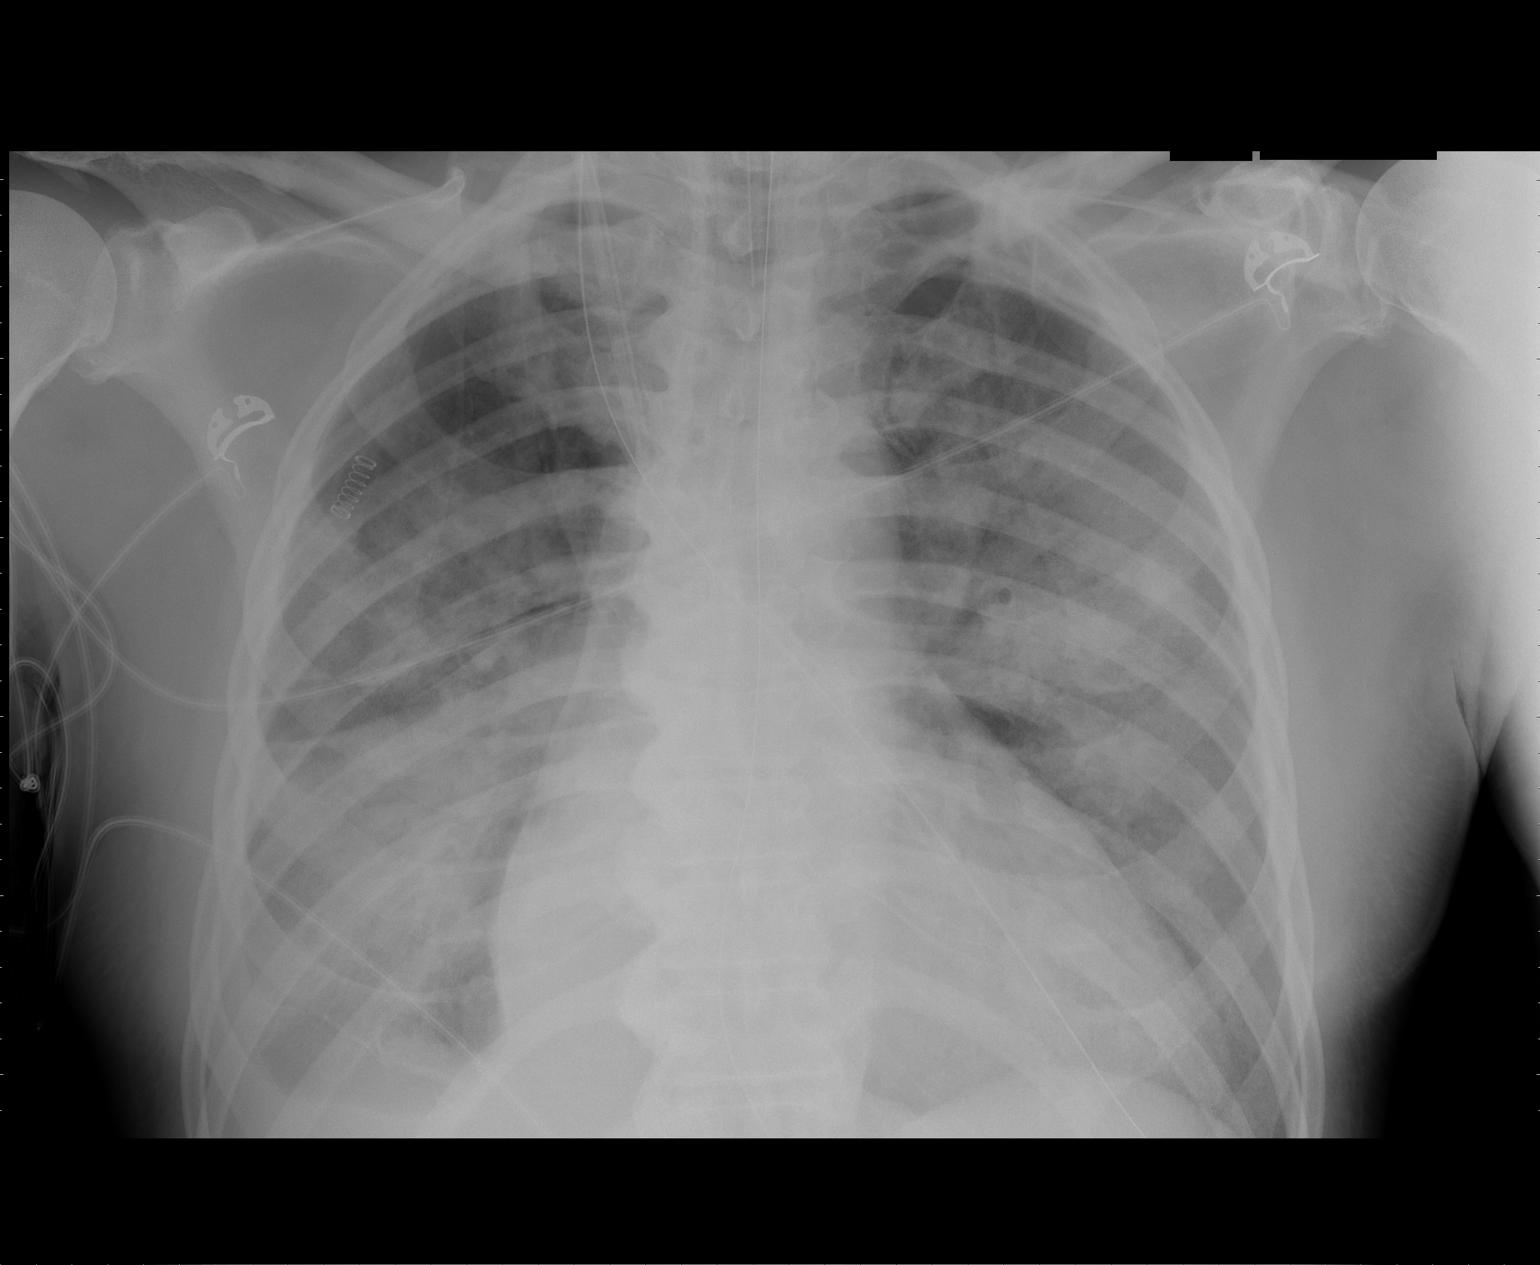

[1 of 1 positions shown; findings below may reference images not displayed]

FINDINGS: Right internal jugular venous catheter tip terminates in
the superior vena cava.  Enteric tube is in place with distal
portion entering stomach area.  Tip is not included on the image.
Endotracheal tube tip terminates 6 cm above the carina.  There is
stable moderate enlargement of the cardiac silhouette.  Bilateral
airspace infiltrative opacities are again seen throughout the
lungs.  There may be very slight improvement but extensive airspace
opacities persist.  No pneumothorax is evident.  There is blunting
of the right costophrenic angle consistent with right pleural
effusion.  There is slightly more air in the right base than on the
previous study.
IMPRESSION: Tip of right internal jugular venous catheter terminates in
superior vena cava upper portion.  No pneumothorax.  Enteric tube
and endotracheal tube in place.  Stable cardiac silhouette
enlargement.  Continued diffuse bilateral airspace infiltrative
opacities.  Right pleural effusion.
